# Patient Record
Sex: Male | Born: 1970 | Race: White | Hispanic: No | Marital: Married | Smoking: Former smoker
Health system: Southern US, Community
[De-identification: ages and names within clinical notes are randomized; demographics above are authoritative.]

## PROBLEM LIST (undated history)

## (undated) DIAGNOSIS — I2510 Atherosclerotic heart disease of native coronary artery without angina pectoris: Secondary | ICD-10-CM

## (undated) DIAGNOSIS — E785 Hyperlipidemia, unspecified: Secondary | ICD-10-CM

## (undated) DIAGNOSIS — R739 Hyperglycemia, unspecified: Secondary | ICD-10-CM

## (undated) DIAGNOSIS — Z720 Tobacco use: Secondary | ICD-10-CM

## (undated) DIAGNOSIS — I255 Ischemic cardiomyopathy: Secondary | ICD-10-CM

## (undated) HISTORY — DX: Hyperglycemia, unspecified: R73.9

## (undated) HISTORY — DX: Ischemic cardiomyopathy: I25.5

## (undated) HISTORY — PX: CAROTID STENT: SHX1301

## (undated) HISTORY — DX: Hyperlipidemia, unspecified: E78.5

## (undated) HISTORY — DX: Atherosclerotic heart disease of native coronary artery without angina pectoris: I25.10

## (undated) HISTORY — DX: Tobacco use: Z72.0

## (undated) HISTORY — PX: ANGIOPLASTY / STENTING FEMORAL: SUR30

---

## 1998-08-28 ENCOUNTER — Emergency Department (HOSPITAL_COMMUNITY): Admission: EM | Admit: 1998-08-28 | Discharge: 1998-08-28 | Payer: Self-pay | Admitting: Emergency Medicine

## 1998-09-11 ENCOUNTER — Encounter: Admission: RE | Admit: 1998-09-11 | Discharge: 1998-09-11 | Payer: Self-pay | Admitting: Internal Medicine

## 2000-09-27 ENCOUNTER — Emergency Department (HOSPITAL_COMMUNITY): Admission: EM | Admit: 2000-09-27 | Discharge: 2000-09-27 | Payer: Self-pay | Admitting: *Deleted

## 2002-09-12 ENCOUNTER — Emergency Department (HOSPITAL_COMMUNITY): Admission: EM | Admit: 2002-09-12 | Discharge: 2002-09-12 | Payer: Self-pay | Admitting: Emergency Medicine

## 2003-08-02 ENCOUNTER — Encounter: Admission: RE | Admit: 2003-08-02 | Discharge: 2003-08-02 | Payer: Self-pay | Admitting: Internal Medicine

## 2004-04-19 ENCOUNTER — Emergency Department (HOSPITAL_COMMUNITY): Admission: EM | Admit: 2004-04-19 | Discharge: 2004-04-19 | Payer: Self-pay | Admitting: Emergency Medicine

## 2005-01-10 ENCOUNTER — Emergency Department (HOSPITAL_COMMUNITY): Admission: EM | Admit: 2005-01-10 | Discharge: 2005-01-10 | Payer: Self-pay | Admitting: Family Medicine

## 2009-01-11 DIAGNOSIS — I2510 Atherosclerotic heart disease of native coronary artery without angina pectoris: Secondary | ICD-10-CM

## 2009-01-11 HISTORY — DX: Atherosclerotic heart disease of native coronary artery without angina pectoris: I25.10

## 2009-03-09 ENCOUNTER — Ambulatory Visit: Payer: Self-pay | Admitting: Internal Medicine

## 2009-03-09 ENCOUNTER — Encounter: Payer: Self-pay | Admitting: Emergency Medicine

## 2009-03-09 ENCOUNTER — Ambulatory Visit: Payer: Self-pay | Admitting: Cardiovascular Disease

## 2009-03-09 ENCOUNTER — Inpatient Hospital Stay (HOSPITAL_COMMUNITY): Admission: EM | Admit: 2009-03-09 | Discharge: 2009-03-12 | Payer: Self-pay | Admitting: Emergency Medicine

## 2009-03-10 DIAGNOSIS — I2510 Atherosclerotic heart disease of native coronary artery without angina pectoris: Secondary | ICD-10-CM

## 2009-03-18 DIAGNOSIS — R7309 Other abnormal glucose: Secondary | ICD-10-CM

## 2009-03-18 DIAGNOSIS — F17200 Nicotine dependence, unspecified, uncomplicated: Secondary | ICD-10-CM | POA: Insufficient documentation

## 2009-03-18 DIAGNOSIS — E785 Hyperlipidemia, unspecified: Secondary | ICD-10-CM | POA: Insufficient documentation

## 2009-03-19 ENCOUNTER — Encounter: Payer: Self-pay | Admitting: Cardiovascular Disease

## 2009-03-21 ENCOUNTER — Ambulatory Visit: Payer: Self-pay | Admitting: Cardiovascular Disease

## 2009-04-11 ENCOUNTER — Telehealth: Payer: Self-pay | Admitting: Cardiovascular Disease

## 2009-04-17 ENCOUNTER — Ambulatory Visit: Payer: Self-pay | Admitting: Internal Medicine

## 2009-04-17 DIAGNOSIS — J329 Chronic sinusitis, unspecified: Secondary | ICD-10-CM | POA: Insufficient documentation

## 2009-04-17 LAB — CONVERTED CEMR LAB
Cholesterol, target level: 200 mg/dL
HDL goal, serum: 40 mg/dL
LDL Goal: 100 mg/dL

## 2009-04-18 ENCOUNTER — Ambulatory Visit: Payer: Self-pay | Admitting: Cardiovascular Disease

## 2009-04-18 DIAGNOSIS — I252 Old myocardial infarction: Secondary | ICD-10-CM

## 2009-04-21 ENCOUNTER — Encounter: Payer: Self-pay | Admitting: Cardiovascular Disease

## 2009-04-22 ENCOUNTER — Ambulatory Visit: Payer: Self-pay | Admitting: Cardiovascular Disease

## 2009-04-22 LAB — CONVERTED CEMR LAB
ALT: 71 units/L — ABNORMAL HIGH (ref 0–53)
AST: 15 units/L (ref 0–37)
Albumin: 4 g/dL (ref 3.5–5.2)
Alkaline Phosphatase: 68 units/L (ref 39–117)
Calcium: 9.2 mg/dL (ref 8.4–10.5)
Cholesterol: 119 mg/dL (ref 0–200)
Creatinine, Ser: 0.9 mg/dL (ref 0.4–1.5)
Hgb A1c MFr Bld: 5.9 % (ref 4.6–6.5)
Total Protein: 7.2 g/dL (ref 6.0–8.3)
Triglycerides: 143 mg/dL (ref 0.0–149.0)

## 2009-08-04 ENCOUNTER — Ambulatory Visit: Payer: Self-pay | Admitting: Cardiovascular Disease

## 2009-08-14 LAB — CONVERTED CEMR LAB
ALT: 42 units/L (ref 0–53)
Total Bilirubin: 0.4 mg/dL (ref 0.3–1.2)

## 2009-10-20 ENCOUNTER — Telehealth (INDEPENDENT_AMBULATORY_CARE_PROVIDER_SITE_OTHER): Payer: Self-pay | Admitting: *Deleted

## 2009-10-22 ENCOUNTER — Encounter: Payer: Self-pay | Admitting: Cardiovascular Disease

## 2009-12-01 ENCOUNTER — Ambulatory Visit: Payer: Self-pay | Admitting: Cardiovascular Disease

## 2009-12-10 ENCOUNTER — Telehealth (INDEPENDENT_AMBULATORY_CARE_PROVIDER_SITE_OTHER): Payer: Self-pay | Admitting: *Deleted

## 2009-12-22 ENCOUNTER — Telehealth (INDEPENDENT_AMBULATORY_CARE_PROVIDER_SITE_OTHER): Payer: Self-pay | Admitting: *Deleted

## 2009-12-23 ENCOUNTER — Telehealth (INDEPENDENT_AMBULATORY_CARE_PROVIDER_SITE_OTHER): Payer: Self-pay | Admitting: *Deleted

## 2009-12-30 ENCOUNTER — Telehealth: Payer: Self-pay | Admitting: Cardiovascular Disease

## 2010-01-09 ENCOUNTER — Telehealth (INDEPENDENT_AMBULATORY_CARE_PROVIDER_SITE_OTHER): Payer: Self-pay | Admitting: *Deleted

## 2010-02-10 NOTE — Assessment & Plan Note (Signed)
Summary: f32m   Visit Type:  Follow-up Primary Provider:  Etta Grandchild MD  CC:  Sob.  History of Present Illness: This is a 40 year-old gentleman who sustained an acute myocardial infarction Feb 28th, 2011 secondary to LCx occlusion. He underwent primary PCI of a complex lesion involving the bifurcation of the first OM branch. He also underwent staged PCI of the LAD during the index hospitalization.  Both lesions were treated with drug-eluting stents. LVEF was well-preserved at the time of his MI. He presents today for follow-up evaluation.   He complains of exertional dyspnea, and relates this to weight gain and longstanding smoking. He quit cigarettes at the time of his MI earlier this year and has gained at least 10 pounds since that time. He denies chest pain, edema, lightheadedness, or syncope. No regular exercise, but does hard physical work.  Current Medications (verified): 1)  Aspirin 81 Mg Tbec (Aspirin) .... Take One Tablet By Mouth Daily 2)  Crestor 40 Mg Tabs (Rosuvastatin Calcium) .... Take 1 Tablet By Mouth Once A Day 3)  Metoprolol Succinate 25 Mg Xr24h-Tab (Metoprolol Succinate) .... Take One Tablet By Mouth Daily 4)  Effient 10 Mg Tabs (Prasugrel Hcl) .... Take 1 Tablet By Mouth Once A Day 5)  Nitrostat 0.4 Mg Subl (Nitroglycerin) .Marland Kitchen.. 1 Tablet Under Tongue At Onset of Chest Pain; You May Repeat Every 5 Minutes For Up To 3 Doses.  Allergies (verified): No Known Drug Allergies  Past History:  Past medical history reviewed for relevance to current acute and chronic problems.  Past Medical History: Reviewed history from 03/21/2009 and no changes required. CAD s/p MI 2011, LCx infarction treated with DES and staged PCI of severe prox LAD stenosis treated with DES. 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 HEENT: normal Neck: normal carotid upstrokes without bruits, JVP normal Lungs: CTA CV: RRR without murmur or gallop Abd: soft, NT, positive BS, no bruit, no organomegaly Ext: no clubbing, cyanosis, or edema. peripheral pulses 2+ and equal Skin: warm and dry without rash    EKG  Procedure date:  08/04/2009  Findings:      NSR 69 bpm, within normal limits.  Impression & Recommendations:  Problem # 1:  CAD, NATIVE VESSEL (ICD-414.01) Pt s/p MI without recurrent angina. He will remain on dual antiplatelet Rx with ASA and effient. Continue risk reduction measures as below. We discussed the importance of exercise and weight loss in detail and I applauded him on his continued abstinence from tobacco.  His updated medication list for this problem includes:    Aspirin 81 Mg Tbec (Aspirin) .Marland Kitchen... Take one tablet by mouth daily    Metoprolol Succinate 25 Mg Xr24h-tab (Metoprolol succinate) .Marland Kitchen... Take one tablet by mouth daily    Effient 10 Mg Tabs (  Prasugrel hcl) .Marland Kitchen... Take 1 tablet by mouth once a day    Nitrostat 0.4 Mg Subl (Nitroglycerin) .Marland Kitchen... 1 tablet under tongue at onset of chest pain; you may repeat every 5 minutes for up to 3 doses.  Orders: EKG w/ Interpretation (93000) TLB-Hepatic/Liver Function Pnl (80076-HEPATIC)  Problem # 2:  HYPERLIPIDEMIA (ICD-272.4) Lipids at goal. Repeat LFT's today as AST was elevated at last check (less than 2x ULN).  His updated medication list for this problem includes:    Crestor 40 Mg Tabs (Rosuvastatin calcium) .Marland Kitchen... Take 1 tablet by  mouth once a day  Orders: EKG w/ Interpretation (93000) TLB-Hepatic/Liver Function Pnl (80076-HEPATIC)  CHOL: 119 (04/18/2009)   LDL: 50 (04/18/2009)   HDL: 40.00 (04/18/2009)   TG: 143.0 (04/18/2009) CHOL (goal): 200 (04/17/2009)   LDL (goal): 100 (04/17/2009)   HDL (goal): 40 (04/17/2009)   TG (goal): 150 (04/17/2009)  Patient Instructions: 1)  Your physician recommends that you schedule a follow-up appointment in: 4 months. 2)  Your physician recommends that you have lab work today: liver (412/272.0) 3)  Your physician recommends that you continue on your current medications as directed. Please refer to the Current Medication list given to you today.

## 2010-02-10 NOTE — Letter (Signed)
Summary: Return To Work  [employer], Main Office  [address]   [place], [place] [address]   Phone: [phone]  Fax: [phone]    03/21/2009  TO: Leodis Sias IT MAY CONCERN   RE: BAYLEY HURN Udell [address] [place]   The above named individual is under my medical care and may return to work on:  April 11, 2009 with no restrictions.  If you have any further questions or need additional information, please call.     Sincerely,   Dr Tonny Bollman

## 2010-02-10 NOTE — Assessment & Plan Note (Signed)
Summary: EPH   Visit Type:  Post-hospital Primary Provider:  none  CC:  Chest soreness.  History of Present Illness: This is a 40 year-old gentleman who sustained an acute myocardial infarction Feb 28th, 2011secondary to LCx occlusion. He underwent primary PCI of a complex lesion involving the bifurcation of the first OM branch. He also underwent staged PCI of the LAD during the index hospitalization.  Both lesions were treated with drug-eluting stents. LVEF was well-preserved at the time of his MI. He presents today for follow-up evaluation.   He has had few chest pains since discharge home from the hospital, all have been nonexertional and fleeting. He denies dyspnea, orthopnea, or PND. No lightheadedness or syncope. He is eager to return to work.  Current Medications (verified): 1)  Aspirin Ec 325 Mg Tbec (Aspirin) .... Take One Tablet By Mouth Daily 2)  Acetaminophen 325 Mg Tabs (Acetaminophen) .... As Needed 3)  Crestor 40 Mg Tabs (Rosuvastatin Calcium) .... Take 1 Tablet By Mouth Once A Day 4)  Metoprolol Tartrate 50 Mg Tabs (Metoprolol Tartrate) .... Take One Tablet By Mouth Twice A Day 5)  Effient 10 Mg Tabs (Prasugrel Hcl) .... Take 1 Tablet By Mouth Once A Day 6)  Nitrostat 0.4 Mg Subl (Nitroglycerin) .Marland Kitchen.. 1 Tablet Under Tongue At Onset of Chest Pain; You May Repeat Every 5 Minutes For Up To 3 Doses.  Allergies (verified): No Known Drug Allergies  Past History:  Past medical history reviewed for relevance to current acute and chronic problems.  Past Medical History: CAD s/p MI 2011, LCx infarction treated with DES and staged PCI of severe prox LAD stenosis treated with DES. 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 HEENT: normal Neck: normal carotid upstrokes without bruits, JVP normal Lungs: CTA CV: RRR without murmur or gallop Abd: soft, NT, positive BS, no bruit, no organomegaly Ext: no clubbing, cyanosis, or edema. peripheral pulses 2+ and equal. Right radial site shows intact pulse with no edema or bruising. Skin: warm and dry without rash    EKG  Procedure date:  03/21/2009  Findings:      NSR with age-indeterminate posterior MI, hyperacute appearing Twaves anteriorly are unchanged from prior tracings.  Impression & Recommendations:  Problem # 1:  CAD, NATIVE VESSEL (ICD-414.01) Pt is stable following his recent MI. Will continue current medical therapy at present. 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 His chest pain sounds noncardiac. Recommend continuation of DAPT with ASA and Effient as well as secondary risk reduction measures as below. We discussed dietary modification and exercise in detail today.  His updated medication list for this problem includes:    Aspirin Ec 325 Mg Tbec (Aspirin) .Marland KitchenMarland KitchenMarland KitchenMarland Kitchen  Take one tablet by mouth daily    Metoprolol Tartrate 50 Mg Tabs (Metoprolol tartrate) .Marland Kitchen... Take one tablet by mouth twice a day    Effient 10 Mg Tabs (Prasugrel hcl) .Marland Kitchen... Take 1 tablet by mouth once a day    Nitrostat 0.4 Mg Subl (Nitroglycerin) .Marland Kitchen... 1 tablet under tongue at onset of chest pain; you may repeat every 5 minutes for up to 3 doses.  Problem # 2:  HYPERLIPIDEMIA (ICD-272.4) Pt new to statin Rx with Crestor - his LDL in the hospital was 179, with a total chol of 250, and HDL 44. Will repeat lipids and LFT's at  the time of his f/u in one month.  His updated medication list for this problem includes:    Crestor 40 Mg Tabs (Rosuvastatin calcium) .Marland Kitchen... Take 1 tablet by mouth once a day  Orders: EKG w/ Interpretation (93000)  Problem # 3:  TOBACCO ABUSE (ICD-305.1) Discussed continued importance of tobacco cessation. His wife has quit as well.  Patient Instructions: 1)  Your physician recommends that you schedule a follow-up appointment in: 1 MONTH 2)  Your physician recommends that you return for a FASTING LIPID, LIVER, BMP, HgbA1C in 1 MONTH (412, 272.0) Nothing to eat or drink after midnight for labwork.  3)  Your physician recommends that you continue on your current medications as directed. Please refer to the Current Medication list given to you today. 4)  Your physician deems you medically cleared to return to work.  A return to work note was provided today.  Pt can return to work on 04/11/09 with no restrictions.

## 2010-02-10 NOTE — Assessment & Plan Note (Signed)
Summary: NEW/ [employer] /NWS #   Vital Signs:  Patient profile:   40 year old male Height:      71 inches (180.34 cm) Weight:      194.50 pounds (88.41 kg) BMI:     27.23 O2 Sat:      98 % on Room air Temp:     97.9 degrees F (36.61 degrees C) oral Pulse rate:   79 / minute Pulse rhythm:   regular Resp:     16 per minute BP sitting:   122 / 86  (left arm) Cuff size:   large  Vitals Entered ByMorrie Sheldon Johnson(April 17, 2009 2:41 PM)  Nutrition Counseling: Patient's BMI is greater than 25 and therefore counseled on weight management options.  O2 Flow:  Room air CC: pt here to establish care/pt concerned about bp medication says taking both doses makes him dizzy and turn white/pt states he stopped taking morning dose/aj, Lipid Management   Primary Care Provider:  Etta Grandchild MD  CC:  pt here to establish care/pt concerned about bp medication says taking both doses makes him dizzy and turn white/pt states he stopped taking morning dose/aj and Lipid Management.  History of Present Illness: New to me is this young man who had an MI one month ago and is doing well after stenting. He sees me today with a 2 year hx. of frequent sinus infections that have been treated at [facility]. He describes taking a course of antibiotics and getting better but then has a recurrence of facial/tooth pain, runny nose, and headaches. He has had migraines since childhood but says these headaches are in a different location (over the face).  Lipid Management History:      Positive NCEP/ATP III risk factors include family history for ischemic heart disease (females less than 37 years old) and ASHD (either angina/prior MI/prior CABG).  Negative NCEP/ATP III risk factors include male age less than 87 years old, non-diabetic, non-tobacco-user status, no prior stroke/TIA, no peripheral vascular disease, and no history of aortic aneurysm.        The patient states that he knows about the "Therapeutic Lifestyle Change"  diet.  His compliance with the TLC diet is fair.  The patient expresses understanding of adjunctive measures for cholesterol lowering.  Adjunctive measures started by the patient include aerobic exercise, fiber, ASA, limit alcohol consumpton, and weight reduction.  He expresses no side effects from his lipid-lowering medication.  The patient denies any symptoms to suggest myopathy or liver disease.     Preventive Screening-Counseling & Management  Alcohol-Tobacco     Alcohol drinks/day: 0     Smoking Status: quit     Smoking Cessation Counseling: yes     Smoke Cessation Stage: quit     Pack years: 40     Tobacco Counseling: to remain off tobacco products  Hep-HIV-STD-Contraception     Hepatitis Risk: no risk noted     HIV Risk: no risk noted     STD Risk: no risk noted      Sexual History:  currently monogamous.        Drug Use:  never.        Blood Transfusions:  no.    Medications Prior to Update: 1)  Aspirin Ec 325 Mg Tbec (Aspirin) .... Take One Tablet By Mouth Daily 2)  Acetaminophen 325 Mg Tabs (Acetaminophen) .... As Needed 3)  Crestor 40 Mg Tabs (Rosuvastatin Calcium) .... Take 1 Tablet By Mouth Once A Day  4)  Metoprolol Tartrate 50 Mg Tabs (Metoprolol Tartrate) .... Take One Tablet By Mouth Twice A Day 5)  Effient 10 Mg Tabs (Prasugrel Hcl) .... Take 1 Tablet By Mouth Once A Day 6)  Nitrostat 0.4 Mg Subl (Nitroglycerin) .Marland Kitchen.. 1 Tablet Under Tongue At Onset of Chest Pain; You May Repeat Every 5 Minutes For Up To 3 Doses.  Current Medications (verified): 1)  Aspirin Ec 325 Mg Tbec (Aspirin) .... Take One Tablet By Mouth Daily 2)  Acetaminophen 325 Mg Tabs (Acetaminophen) .... As Needed 3)  Crestor 40 Mg Tabs (Rosuvastatin Calcium) .... Take 1 Tablet By Mouth Once A Day 4)  Metoprolol Tartrate 50 Mg Tabs (Metoprolol Tartrate) .... Take One Tablet By Mouth Twice A Day 5)  Effient 10 Mg Tabs (Prasugrel Hcl) .... Take 1 Tablet By Mouth Once A Day 6)  Nitrostat 0.4 Mg Subl  (Nitroglycerin) .Marland Kitchen.. 1 Tablet Under Tongue At Onset of Chest Pain; You May Repeat Every 5 Minutes For Up To 3 Doses. 7)  Ceftin 500 Mg Tab (Cefuroxime Axetil) .... Take One (1) Tablet By Mouth Two (2) Times A Day X 30 Days  Allergies (verified): No Known Drug Allergies  Past History:  Past Medical History: Reviewed history from 03/21/2009 and no changes required. CAD s/p MI 2011, LCx infarction treated with DES and staged PCI of severe prox LAD stenosis treated with DES. 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      Family History: Reviewed history from 03/18/2009 and no changes required.   Both of his parents are living and he is not aware of  coronary artery disease in either parents.  There is no coronary artery  disease in siblings and no family history of sudden death in any  members.   Social History: Reviewed history from 03/18/2009 and no changes required.  [place]   and family.  He works as an Personnel officer.  He has a greater than 20-pack-  year history of ongoing tobacco use, but denies any history of alcohol  or drug abuse.  Smoking Status:  quit Hepatitis Risk:  no risk noted HIV Risk:  no risk noted STD Risk:  no risk noted Sexual History:  currently monogamous Drug Use:  never Blood Transfusions:  no  Review of Systems  The patient denies anorexia, fever, weight loss, prolonged cough, abdominal pain, suspicious skin lesions, depression, and enlarged lymph nodes.   ENT:  Complains of nasal congestion, nosebleeds, postnasal drainage, and sinus pressure; denies decreased hearing, difficulty swallowing, ear discharge, earache, hoarseness, ringing in ears, and sore throat. Resp:  Denies  chest pain with inspiration, cough, coughing up blood, shortness of breath, sputum productive, and wheezing.  Physical Exam  General:  alert, well-developed, well-nourished, well-hydrated, appropriate dress, normal appearance, healthy-appearing, cooperative to examination, and good hygiene.   Head:  normocephalic, atraumatic, no abnormalities observed, and no abnormalities palpated.   Eyes:  No corneal or conjunctival inflammation noted. EOMI. Perrla. Funduscopic exam benign, without hemorrhages, exudates or papilledema. Vision grossly normal. Ears:  R ear normal and L ear normal.   Nose:  External nasal examination shows no deformity or inflammation. Nasal mucosa are pink and moist without lesions or exudates. Mouth:  Oral mucosa and oropharynx without lesions or exudates.  Teeth in  good repair. Neck:  supple, full ROM, no masses, no thyromegaly, and no JVD.   Lungs:  Normal respiratory effort, chest expands symmetrically. Lungs are clear to auscultation, no crackles or wheezes. Heart:  Normal rate and regular rhythm. S1 and S2 normal without gallop, murmur, click, rub or other extra sounds. Abdomen:  Bowel sounds positive,abdomen soft and non-tender without masses, organomegaly or hernias noted. Msk:  No deformity or scoliosis noted of thoracic or lumbar spine.   Pulses:  R and L carotid,radial,femoral,dorsalis pedis and posterior tibial pulses are full and equal bilaterally Extremities:  No clubbing, cyanosis, edema, or deformity noted with normal full range of motion of all joints.   Neurologic:  No cranial nerve deficits noted. Station and gait are normal. Plantar reflexes are down-going bilaterally. DTRs are symmetrical throughout. Sensory, motor and coordinative functions appear intact. Skin:  turgor normal, color normal, no rashes, no suspicious lesions, no ecchymoses, no petechiae, no purpura, no ulcerations, and no edema.   Cervical Nodes:  no anterior cervical adenopathy and no  posterior cervical adenopathy.   Axillary Nodes:  no R axillary adenopathy and no L axillary adenopathy.   Inguinal Nodes:  no R inguinal adenopathy and no L inguinal adenopathy.   Psych:  Cognition and judgment appear intact. Alert and cooperative with normal attention span and concentration. No apparent delusions, illusions, hallucinations   Impression & Recommendations:  Problem # 1:  SINUSITIS, CHRONIC (ICD-473.9) Assessment New  His updated medication list for this problem includes:    Ceftin 500 Mg Tab (Cefuroxime axetil) .Marland Kitchen... Take one (1) tablet by mouth two (2) times a day x 30 days  Orders: T-Sinuses Complete (70220TC)  Take antibiotics for full duration. Discussed treatment options including indications for coronal CT scan of sinuses and ENT referral.   Complete Medication List: 1)  Aspirin Ec 325 Mg Tbec (Aspirin) .... Take one tablet by mouth daily 2)  Acetaminophen 325 Mg Tabs (Acetaminophen) .... As needed 3)  Crestor 40 Mg Tabs (Rosuvastatin calcium) .... Take 1 tablet by mouth once a day 4)  Metoprolol Tartrate 50 Mg Tabs (Metoprolol tartrate) .... Take one tablet by mouth twice a day 5)  Effient 10 Mg Tabs (Prasugrel hcl) .... Take 1 tablet by mouth once a day 6)  Nitrostat 0.4 Mg Subl (Nitroglycerin) .Marland Kitchen.. 1 tablet under tongue at onset of chest pain; you may repeat every 5 minutes for up to 3 doses. 7)  Ceftin 500 Mg Tab (Cefuroxime axetil) .... Take one (1) tablet by mouth two (2) times a day x 30 days  Lipid Assessment/Plan:      Based on NCEP/ATP III, the patient's risk factor category is "history of coronary disease, peripheral vascular disease, cerebrovascular disease, or aortic aneurysm".  The patient's lipid goals are as follows: Total cholesterol goal is 200; LDL cholesterol goal is 100; HDL cholesterol goal is 40; Triglyceride goal is 150.    Patient Instructions: 1)  Please schedule a follow-up appointment in 1 month. 2)  Take your antibiotic as  prescribed until ALL of it is gone, but stop if you develop a rash or swelling and contact our office as soon as possible. 3)  Acute sinusitis symptoms for less than 10 days are not helped by antibiotics.Use warm moist compresses, and over the counter decongestants ( only as directed). Call if no improvement in 5-7 days, sooner if increasing pain, fever, or new symptoms. Prescriptions: CEFTIN 500 MG TAB (CEFUROXIME AXETIL) Take one (1) tablet by mouth two (2) times  a day X 30 days  #60 x 1   Entered and Authorized by:   Etta Grandchild MD   Signed by:   Etta Grandchild MD on 04/17/2009   Method used:   Electronically to        [facility]. [phone]* (retail)       [address]       [place], [place]  [address]       Ph: [phone]       Fax: [phone]   RxID:   [id number]

## 2010-02-10 NOTE — Progress Notes (Signed)
Summary: resend refill to correct pharmacy  Phone Note Call from Patient   Caller: Spouse Summary of Call: refill went to [facility], [facility] Initial call taken by: Glynda Jaeger,  December 10, 2009 12:51 PM  Follow-up for Phone Call        RX faxed to pharmacy. Vikki Ports  December 11, 2009 2:31 PM     Prescriptions: CRESTOR 10 MG TABS (ROSUVASTATIN CALCIUM) Take one tablet by mouth daily.  #30 x 8   Entered by:   Vikki Ports   Authorized by:   Norva Karvonen, MD   Signed by:   Vikki Ports on 12/11/2009   Method used:   Faxed to ...       [facility] [address] [phone]* (retail)       [address] [place], [place]  [address]       Ph: [phone]       Fax: [phone]   RxID:   [id number]

## 2010-02-10 NOTE — Letter (Signed)
Summary: [facility] Notice of Action   [facility] Notice of Action   Imported By: Roderic Ovens 11/13/2009 11:16:50  _____________________________________________________________________  External Attachment:    Type:   Image     Comment:   External Document

## 2010-02-10 NOTE — Progress Notes (Signed)
Summary: Effient Approval  Phone Note Call from Patient Call back at Home Phone [phone]   Caller: Spouse Summary of Call: insurance will not cover effient, please call insurance company [employer] @ [phone] ID # Russell Miller [id number] Initial call taken by: Migdalia Dk,  April 11, 2009 9:53 AM  Follow-up for Phone Call        I spoke with the pt and he took his last pill of effient today.  I placed 28 tablets of Effient at the front desk for the pt to pick-up today.  Per Dr Excell Seltzer he places all STEMI pt's on Effient.  Better outcomes.   Follow-up by: Julieta Gutting, RN, BSN,  April 11, 2009 2:06 PM  Additional Follow-up for Phone Call Additional follow up Details #1::        The pt came into the office and picked up samples.  The pt said he had been taking Metoprolol Tartrate 50mg  two times a day.  The pt said the second dose made him feel bad so he has only been taking this in the morning and feels better.  The pt saw his PCP and his BP was okay.  The pt is scheduled to see Dr Excell Seltzer on 04/22/09.  I told the pt to keep that appt and we would review his medications at that time.    Additional Follow-up by: Julieta Gutting, RN, BSN,  April 18, 2009 9:10 AM    Additional Follow-up for Phone Call Additional follow up Details #2::    Correct number for authorization [id number].  Effient approved through April 2012.   Follow-up by: Julieta Gutting, RN, BSN,  April 21, 2009 2:49 PM

## 2010-02-10 NOTE — Assessment & Plan Note (Signed)
Summary: 1 month rov   Visit Type:  1 month follow up Primary Provider:  Etta Grandchild MD  CC:  No cardiac complaints.  History of Present Illness: This is a 40 year-old gentleman who sustained an acute myocardial infarction Feb 28th, 2011 secondary to LCx occlusion. He underwent primary PCI of a complex lesion involving the bifurcation of the first OM branch. He also underwent staged PCI of the LAD during the index hospitalization.  Both lesions were treated with drug-eluting stents. LVEF was well-preserved at the time of his MI. He presents today for follow-up evaluation.   Overall he is doing well at present. He had a lot of difficulty with weakness and lightheadedness at work. He reduced his beta blocker dose on his own and is taking it only at bedtime.  He feels better with the once daily dosing, but continues to have some lightheadedness. He denies chest pain, dyspnea, edema, or palpitations.  Current Medications (verified): 1)  Aspirin Ec 325 Mg Tbec (Aspirin) .... Take One Tablet By Mouth Daily 2)  Crestor 40 Mg Tabs (Rosuvastatin Calcium) .... Take 1 Tablet By Mouth Once A Day 3)  Metoprolol Tartrate 50 Mg Tabs (Metoprolol Tartrate) .... Take One Tablet By Mouth Twice A Day 4)  Effient 10 Mg Tabs (Prasugrel Hcl) .... Take 1 Tablet By Mouth Once A Day 5)  Nitrostat 0.4 Mg Subl (Nitroglycerin) .Marland Kitchen.. 1 Tablet Under Tongue At Onset of Chest Pain; You May Repeat Every 5 Minutes For Up To 3 Doses.  Allergies (verified): No Known Drug Allergies  Past History:  Past medical history reviewed for relevance to current acute and chronic problems.  Past Medical History: Reviewed history from 03/21/2009 and no changes required. CAD s/p MI 2011, LCx infarction treated with DES and staged PCI of severe prox LAD stenosis treated with DES. ISCHEMIC CARDIOMYOPATHY (ICD-414.8), mild with LVEF greater than 45% HYPERLIPIDEMIA (ICD-272.4) HYPERGLYCEMIA (ICD-790.29) TOBACCO ABUSE (ICD-305.1), quit  at time of MI Feb 2011  Review of Systems       Negative except as per HPI   Vital Signs:  Patient profile:   40 year old male Height:      71 inches Weight:      198.75 pounds BMI:     27.82 Pulse rate:   70 / minute Pulse rhythm:   regular Resp:     18 per minute BP sitting:   112 / 71  (left arm) Cuff size:   large  Vitals Entered By: Vikki Ports (April 22, 2009 3:56 PM)  Physical Exam  General:  Pt is alert and oriented, in no acute distress. HEENT: normal Neck: normal carotid upstrokes without bruits, JVP normal Lungs: CTA CV: RRR without murmur or gallop Abd: soft, NT, positive BS, no bruit, no organomegaly Ext: no clubbing, cyanosis, or edema. peripheral pulses 2+ and equal Skin: warm and dry without rash    Impression & Recommendations:  Problem # 1:  CAD, NATIVE VESSEL (ICD-414.01) Patient status post recent MI. He is not tolerating beta blocker therapy well, likely secondary to symptomatic hypotension. Will reduce his overall dose and put him on metoprolol succinate rather than tartrate. Will dose this at 25 mg at bedtime. Otherwise continue dual antiplatelet therapy with aspirin and prasugrel, but reduce ASA dose to 81 mg daily.  His updated medication list for this problem includes:    Aspirin 81 Mg Tbec (Aspirin) .Marland Kitchen... Take one tablet by mouth daily    Metoprolol Succinate 25 Mg Xr24h-tab (Metoprolol succinate) .Marland KitchenMarland KitchenMarland KitchenMarland Kitchen  Take one tablet by mouth daily    Effient 10 Mg Tabs (Prasugrel hcl) .Marland Kitchen... Take 1 tablet by mouth once a day    Nitrostat 0.4 Mg Subl (Nitroglycerin) .Marland Kitchen... 1 tablet under tongue at onset of chest pain; you may repeat every 5 minutes for up to 3 doses.  His updated medication list for this problem includes:    Aspirin 81 Mg Tbec (Aspirin) .Marland Kitchen... Take one tablet by mouth daily    Metoprolol Succinate 25 Mg Xr24h-tab (Metoprolol succinate) .Marland Kitchen... Take one tablet by mouth daily    Effient 10 Mg Tabs (Prasugrel hcl) .Marland Kitchen... Take 1 tablet by mouth  once a day    Nitrostat 0.4 Mg Subl (Nitroglycerin) .Marland Kitchen... 1 tablet under tongue at onset of chest pain; you may repeat every 5 minutes for up to 3 doses.  Problem # 2:  HYPERLIPIDEMIA (ICD-272.4) Recent labs reviewed, LDL at goal of less than 70. AST was mildly elevated and we'll repeat LFTs in 3 months.  His updated medication list for this problem includes:    Crestor 40 Mg Tabs (Rosuvastatin calcium) .Marland Kitchen... Take 1 tablet by mouth once a day  CHOL: 119 (04/18/2009)   LDL: 50 (04/18/2009)   HDL: 40.00 (04/18/2009)   TG: 143.0 (04/18/2009) CHOL (goal): 200 (04/17/2009)   LDL (goal): 100 (04/17/2009)   HDL (goal): 40 (04/17/2009)   TG (goal): 150 (04/17/2009)  Problem # 3:  HYPERGLYCEMIA (ICD-790.29) Reviewed the importance of low carbohydrate diet to prevent progression towards type 2 diabetes.  Patient Instructions: 1)  Your physician recommends that you schedule a follow-up appointment in: 3 MONTHS 2)  Your physician has recommended you make the following change in your medication: DECREASE Aspirin to 81mg  once a day, STOP Metoprolol Tartrate, START Metoprolol Succinate 25mg  at bedtime 3)  Your physician recommends that you have lab work in: 3 MONTHS (Liver 412, 272.0)  Prescriptions: METOPROLOL SUCCINATE 25 MG XR24H-TAB (METOPROLOL SUCCINATE) Take one tablet by mouth daily  #30 x 6   Entered by:   Julieta Gutting, RN, BSN   Authorized by:   Norva Karvonen, MD   Signed by:   Julieta Gutting, RN, BSN on 04/22/2009   Method used:   Electronically to        [facility]. [phone]* (retail)       [address] [place], [place]  [address]       Ph: [phone]       Fax: [phone]   RxID:   [id number]

## 2010-02-10 NOTE — Assessment & Plan Note (Signed)
Summary: 4 month rov   Visit Type:  4 months follow up Primary Provider:  Etta Grandchild MD  CC:  Sob.  History of Present Illness: This is a 40 year-old gentleman who sustained an acute myocardial infarction Feb 28th, 2011 secondary to LCx occlusion. He underwent primary PCI of a complex lesion involving the bifurcation of the first OM branch. He also underwent staged PCI of the LAD during the index hospitalization.  Both lesions were treated with drug-eluting stents. LVEF was well-preserved at the time of his MI. He presents today for follow-up evaluation.   He continues to have short paroxysms of dyspnea, unrelated to exertion. He denies edema, orthopnea, or PND. No chest pain. No symptoms that are reminiscent of his MI. No cough or other complaints.  Current Medications (verified): 1)  Aspirin 81 Mg Tbec (Aspirin) .... Take One Tablet By Mouth Daily 2)  Crestor 40 Mg Tabs (Rosuvastatin Calcium) .... Take 1 Tablet By Mouth Once A Day 3)  Metoprolol Succinate 25 Mg Xr24h-Tab (Metoprolol Succinate) .... Take One Tablet By Mouth Daily 4)  Effient 10 Mg Tabs (Prasugrel Hcl) .... Take 1 Tablet By Mouth Once A Day 5)  Nitrostat 0.4 Mg Subl (Nitroglycerin) .Marland Kitchen.. 1 Tablet Under Tongue At Onset of Chest Pain; You May Repeat Every 5 Minutes For Up To 3 Doses.  Allergies (verified): No Known Drug Allergies  Past History:  Past medical history reviewed for relevance to current acute and chronic problems.  Past Medical History: Reviewed history from 03/21/2009 and no changes required. CAD s/p MI 2011, LCx infarction treated with DES and staged PCI of severe prox LAD stenosis treated with DES. 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 HEENT: normal Neck: normal carotid upstrokes without bruits, JVP normal Lungs: CTA CV: RRR without murmur or gallop Abd: soft, NT, positive BS, no bruit, no organomegaly Ext: no clubbing, cyanosis, or edema. peripheral pulses 2+ and equal Skin: warm and dry without rash    Impression & Recommendations:  Problem # 1:  OLD MYOCARDIAL INFARCTION (ICD-412) Pt on appropriate Rx - lipid lowering, ASA, Effient, beta-blocker. Continue current medical program. He has quit smoking. Encouraged exercise.  His updated medication list for this problem includes:    Aspirin 81 Mg Tbec (Aspirin) .Marland Kitchen... Take one tablet by mouth daily    Metoprolol Succinate 25 Mg Xr24h-tab (Metoprolol succinate) .Marland Kitchen... Take one tablet by mouth daily    Effient 10 Mg Tabs (Prasugrel hcl) .Marland Kitchen... Take 1 tablet by mouth once a day    Nitrostat 0.4 Mg Subl (Nitroglycerin) .Marland Kitchen... 1 tablet under tongue at onset of chest pain; you may repeat every 5 minutes for up to 3 doses.  Problem # 2:  HYPERLIPIDEMIA (ICD-272.4) He reports myalgias and rosuvastatin dose reduced.  His updated medication list for this problem includes:    Crestor 10 Mg Tabs (Rosuvastatin calcium) .Marland Kitchen... Take one tablet by mouth daily.  CHOL: 119 (04/18/2009)   LDL: 50 (04/18/2009)   HDL: 40.00 (04/18/2009)   TG: 143.0 (04/18/2009) CHOL (goal): 200 (04/17/2009)   LDL (goal): 100 (04/17/2009)   HDL (goal): 40 (04/17/2009)   TG (goal): 150 (04/17/2009)  Other Orders: Treadmill (Treadmill)  Patient Instructions: 1)  Your physician recommends that you return for a FASTING LIPID and  LIVER Profile in [facility] 2012---Nothing to eat or drink after midnight (412, 272.0)  2)  Your physician has recommended you make the following change in your medication: DECREASE Crestor to 10mg  once a day 3)  Your physician has requested that you have an exercise tolerance test in [facility] 2012.  For further information please visit [email or web address]  Please also follow instruction sheet, as given. Prescriptions: CRESTOR 10 MG TABS (ROSUVASTATIN CALCIUM) Take one tablet by mouth daily.  #30 x 8   Entered by:   Julieta Gutting, RN, BSN   Authorized by:   Norva Karvonen, MD   Signed by:   Julieta Gutting, RN, BSN on 12/01/2009   Method used:   Electronically to        [facility] [address] [phone]* (retail)       [address] [place], [place]  [address]       Ph: [phone]       Fax: [phone]   RxID:   [id number]

## 2010-02-10 NOTE — Progress Notes (Signed)
Summary: refill  Phone Note Refill Request Message from:  Patient on October 20, 2009 3:42 PM  Refills Requested: Medication #1:  EFFIENT 10 MG TABS Take 1 tablet by mouth once a day [facility] [phone]  Initial call taken by: Judie Grieve,  October 20, 2009 3:42 PM  Follow-up for Phone Call        Rx faxed to pharmacy. Vikki Ports  October 21, 2009 11:47 AM     Prescriptions: EFFIENT 10 MG TABS (PRASUGREL HCL) Take 1 tablet by mouth once a day  #30 x 6   Entered by:   Vikki Ports   Authorized by:   Norva Karvonen, MD   Signed by:   Vikki Ports on 10/21/2009   Method used:   Faxed to ...       [facility] [address] [facility] [phone]* (retail)       [address]       [place], [place]  [address]       Ph: [phone]       Fax: [phone]   RxID:   [id number]

## 2010-02-10 NOTE — Letter (Signed)
Summary: [facility]   Imported By: Kassie Mends 04/11/2009 09:34:20  _____________________________________________________________________  External Attachment:    Type:   Image     Comment:   External Document

## 2010-02-12 NOTE — Progress Notes (Signed)
  ROI received Via Fax, All Cardiac records faxed to [facility] @ [phone]. [facility] [place]  January 09, 2010 11:07 AM

## 2010-02-12 NOTE — Progress Notes (Signed)
Summary: Change cholesterol med  Phone Note Outgoing Call   Call placed by: Julieta Gutting, RN, BSN,  December 30, 2009 11:18 AM Call placed to: Patient Summary of Call: Crestor is not approved by the pt's insurance.  Per Dr Excell Seltzer the pt can switch to Simvastatin 40mg  daily and recheck lipid and liver in 3 MONTHS. I left message for the pt to call back.  Julieta Gutting, RN, BSN  December 30, 2009 11:19 AM  Follow-up for Phone Call        pt's wife brandy returning call [phone] ext [phone] Glynda Jaeger  December 31, 2009 10:04 AM  I spoke with the pt's wife and made her aware that we need to change the pt's cholesterol medication.  A Rx will be sent in for Simvastatin 40mg  daily.  The pt is scheduled for labwork in March.  Follow-up by: Julieta Gutting, RN, BSN,  December 31, 2009 2:27 PM    New/Updated Medications: SIMVASTATIN 40 MG TABS (SIMVASTATIN) Take one tablet by mouth daily at bedtime Prescriptions: SIMVASTATIN 40 MG TABS (SIMVASTATIN) Take one tablet by mouth daily at bedtime  #30 x 8   Entered by:   Julieta Gutting, RN, BSN   Authorized by:   Norva Karvonen, MD   Signed by:   Julieta Gutting, RN, BSN on 12/31/2009   Method used:   Electronically to        [facility] [address] [phone]* (retail)       [address] [place], [place]  [address]       Ph: [phone]       Fax: [phone]   RxID:   [id number]

## 2010-02-12 NOTE — Progress Notes (Signed)
Summary: STATUS OF PRE AUTH FOR CRESTOR  Phone Note Call from Patient   Caller: Patient [phone] ORK TO LEAVE MESSAGE Reason for Call: Acute Illness, Talk to Nurse Summary of Call: PT'S WIFE CALLING BACK RE PRE AUTH-UPSET PT BEEN OFF MED FOR 3 WEEKS-WANTS TO KNOW THE STATUS Initial call taken by: Glynda Jaeger,  December 23, 2009 12:49 PM  Follow-up for Phone Call        Samples on crestor 10mg  # 35 placed at front desk. Message left pt. to pick them up. prior auth. form will be faxed to [place] from insurance company. Vikki Ports  December 23, 2009 4:21 PM

## 2010-02-12 NOTE — Progress Notes (Signed)
Summary: medication need authorization  Phone Note Refill Request Message from:  Patient on December 22, 2009 2:56 PM  Refills Requested: Medication #1:  CRESTOR 10 MG TABS Take one tablet by mouth daily. [facility] need preauthorization  Initial call taken by: Judie Grieve,  December 22, 2009 2:57 PM Caller: Patient  Follow-up for Phone Call        Crestor 10mg  35 samples placed at front desk. Message left pt. at answer machine to pick them up. Prior auth. form requested to insurance.Vikki Ports  December 23, 2009 3:52 PM

## 2010-02-13 NOTE — Letter (Signed)
Summary: IT trainer Form   [employer] [place]   Imported By: Roderic Ovens 11/21/2009 14:15:42  _____________________________________________________________________  External Attachment:    Type:   Image     Comment:   External Document

## 2010-03-01 ENCOUNTER — Emergency Department (HOSPITAL_COMMUNITY)
Admission: EM | Admit: 2010-03-01 | Discharge: 2010-03-01 | Disposition: A | Discharge status: Home / Self Care | Payer: BC Managed Care – PPO | Attending: Emergency Medicine | Admitting: Emergency Medicine

## 2010-03-01 ENCOUNTER — Emergency Department (HOSPITAL_COMMUNITY): Payer: BC Managed Care – PPO

## 2010-03-01 DIAGNOSIS — E7800 Pure hypercholesterolemia, unspecified: Secondary | ICD-10-CM | POA: Insufficient documentation

## 2010-03-01 DIAGNOSIS — I252 Old myocardial infarction: Secondary | ICD-10-CM | POA: Insufficient documentation

## 2010-03-01 DIAGNOSIS — Y929 Unspecified place or not applicable: Secondary | ICD-10-CM | POA: Insufficient documentation

## 2010-03-01 DIAGNOSIS — M25569 Pain in unspecified knee: Secondary | ICD-10-CM | POA: Insufficient documentation

## 2010-03-01 DIAGNOSIS — IMO0002 Reserved for concepts with insufficient information to code with codable children: Secondary | ICD-10-CM | POA: Insufficient documentation

## 2010-03-01 DIAGNOSIS — S81809A Unspecified open wound, unspecified lower leg, initial encounter: Secondary | ICD-10-CM | POA: Insufficient documentation

## 2010-03-01 DIAGNOSIS — S81009A Unspecified open wound, unspecified knee, initial encounter: Secondary | ICD-10-CM | POA: Insufficient documentation

## 2010-03-01 DIAGNOSIS — Z79899 Other long term (current) drug therapy: Secondary | ICD-10-CM | POA: Insufficient documentation

## 2010-03-24 ENCOUNTER — Encounter (INDEPENDENT_AMBULATORY_CARE_PROVIDER_SITE_OTHER): Payer: BC Managed Care – PPO

## 2010-03-24 ENCOUNTER — Encounter: Payer: Self-pay | Admitting: Physician Assistant

## 2010-03-24 ENCOUNTER — Encounter (INDEPENDENT_AMBULATORY_CARE_PROVIDER_SITE_OTHER): Payer: BC Managed Care – PPO | Admitting: Cardiovascular Disease

## 2010-03-24 ENCOUNTER — Other Ambulatory Visit (INDEPENDENT_AMBULATORY_CARE_PROVIDER_SITE_OTHER): Payer: BC Managed Care – PPO

## 2010-03-24 ENCOUNTER — Encounter: Payer: Self-pay | Admitting: Cardiovascular Disease

## 2010-03-24 ENCOUNTER — Other Ambulatory Visit: Payer: Self-pay | Admitting: Cardiovascular Disease

## 2010-03-24 DIAGNOSIS — E785 Hyperlipidemia, unspecified: Secondary | ICD-10-CM

## 2010-03-24 DIAGNOSIS — R0989 Other specified symptoms and signs involving the circulatory and respiratory systems: Secondary | ICD-10-CM

## 2010-03-24 DIAGNOSIS — I252 Old myocardial infarction: Secondary | ICD-10-CM

## 2010-03-24 LAB — HEPATIC FUNCTION PANEL
ALT: 50 U/L (ref 0–53)
AST: 25 U/L (ref 0–37)
Bilirubin, Direct: 0.1 mg/dL (ref 0.0–0.3)
Total Bilirubin: 0.6 mg/dL (ref 0.3–1.2)

## 2010-03-24 LAB — LIPID PANEL
Total CHOL/HDL Ratio: 7
Triglycerides: 281 mg/dL — ABNORMAL HIGH (ref 0.0–149.0)

## 2010-03-24 LAB — LDL CHOLESTEROL, DIRECT: Direct LDL: 196.5 mg/dL

## 2010-03-26 ENCOUNTER — Telehealth: Payer: Self-pay | Admitting: Cardiology

## 2010-03-31 NOTE — Miscellaneous (Signed)
Summary: Restart Crestor  Clinical Lists Changes  Medications: Changed medication from SIMVASTATIN 40 MG TABS (SIMVASTATIN) Take one tablet by mouth daily at bedtime to CRESTOR 10 MG TABS (ROSUVASTATIN CALCIUM) Take one tablet by mouth daily. - Signed Rx of CRESTOR 10 MG TABS (ROSUVASTATIN CALCIUM) Take one tablet by mouth daily.;  #30 x 6;  Signed;  Entered by: Julieta Gutting, RN, BSN;  Authorized by: Norva Karvonen, MD;  Method used: Electronically to [facility]. [phone]*, [address], [place], [place]  [address], Ph: [phone], Fax: [phone]    Prescriptions: CRESTOR 10 MG TABS (ROSUVASTATIN CALCIUM) Take one tablet by mouth daily.  #30 x 6   Entered by:   Julieta Gutting, RN, BSN   Authorized by:   Norva Karvonen, MD   Signed by:   Julieta Gutting, RN, BSN on 03/24/2010   Method used:   Electronically to        [facility] [address] [phone]* (retail)       [address] [place], [place]  [address]       Ph: [phone]       Fax: [phone]   RxID:   [id number]  Per Dr Excell Seltzer the pt stopped Simvastatin due to dizziness after taking for 3 days.  Dr Excell Seltzer recommended that the pt restart Crestor 10mg  daily.  Julieta Gutting, RN, BSN  March 24, 2010 11:04 AM

## 2010-03-31 NOTE — Progress Notes (Signed)
Summary: lab results  Phone Note Call from Patient Call back at [phone]   Caller: Spouse/Brandi Reason for Call: Talk to Nurse, Talk to Doctor, Lab or Test Results Summary of Call: rtn call from yesterday to get results Initial call taken by: Omer Jack,  March 26, 2010 1:14 PM  Follow-up for Phone Call        Called patient's wife concerning lipid panel results and order to restart Crestor per Dr.Cooper. She will go to pharmacy to pick up medication.  Layne Benton, RN, BSN  March 26, 2010 1:30 PM

## 2010-04-01 LAB — BASIC METABOLIC PANEL
BUN: 4 mg/dL — ABNORMAL LOW (ref 6–23)
CO2: 25 mEq/L (ref 19–32)
Calcium: 8.7 mg/dL (ref 8.4–10.5)
Chloride: 103 mEq/L (ref 96–112)
Creatinine, Ser: 0.67 mg/dL (ref 0.4–1.5)

## 2010-04-01 LAB — CK TOTAL AND CKMB (NOT AT ARMC): Relative Index: 6.8 — ABNORMAL HIGH (ref 0.0–2.5)

## 2010-04-01 LAB — CBC
HCT: 42.4 % (ref 39.0–52.0)
Hemoglobin: 14.6 g/dL (ref 13.0–17.0)
RDW: 12.4 % (ref 11.5–15.5)
WBC: 16.6 10*3/uL — ABNORMAL HIGH (ref 4.0–10.5)

## 2010-04-01 LAB — MRSA CULTURE

## 2010-04-01 LAB — LIPID PANEL
Cholesterol: 250 mg/dL — ABNORMAL HIGH (ref 0–200)
HDL: 44 mg/dL (ref >39)
Triglycerides: 135 mg/dL (ref <150)

## 2010-04-01 LAB — TROPONIN I: Troponin I: 89.66 ng/mL (ref 0.00–0.06)

## 2010-04-02 LAB — TROPONIN I: Troponin I: 13.02 ng/mL (ref 0.00–0.06)

## 2010-04-02 LAB — CK TOTAL AND CKMB (NOT AT ARMC)
CK, MB: 56 ng/mL (ref 0.3–4.0)
Relative Index: 8 — ABNORMAL HIGH (ref 0.0–2.5)

## 2010-04-02 LAB — D-DIMER, QUANTITATIVE: D-Dimer, Quant: 1.88 ug/mL-FEU — ABNORMAL HIGH (ref 0.00–0.48)

## 2010-04-02 LAB — POCT I-STAT, CHEM 8
Calcium, Ion: 1.13 mmol/L (ref 1.12–1.32)
HCT: 46 % (ref 39.0–52.0)
TCO2: 21 mmol/L (ref 0–100)

## 2010-04-02 LAB — POCT CARDIAC MARKERS

## 2010-04-02 LAB — PROTIME-INR
INR: 0.91 (ref 0.00–1.49)
Prothrombin Time: 12.2 seconds (ref 11.6–15.2)

## 2010-04-05 LAB — BASIC METABOLIC PANEL
BUN: 7 mg/dL (ref 6–23)
BUN: 7 mg/dL (ref 6–23)
Chloride: 102 mEq/L (ref 96–112)
Chloride: 105 mEq/L (ref 96–112)
Creatinine, Ser: 0.85 mg/dL (ref 0.4–1.5)
Glucose, Bld: 115 mg/dL — ABNORMAL HIGH (ref 70–99)
Glucose, Bld: 125 mg/dL — ABNORMAL HIGH (ref 70–99)
Potassium: 4.1 mEq/L (ref 3.5–5.1)

## 2010-04-05 LAB — CBC
HCT: 44.9 % (ref 39.0–52.0)
Hemoglobin: 15.4 g/dL (ref 13.0–17.0)
MCHC: 34.3 g/dL (ref 30.0–36.0)
MCV: 92.2 fL (ref 78.0–100.0)
MCV: 92.7 fL (ref 78.0–100.0)
Platelets: 271 10*3/uL (ref 150–400)
Platelets: 279 10*3/uL (ref 150–400)
RDW: 12.7 % (ref 11.5–15.5)
WBC: 12.4 10*3/uL — ABNORMAL HIGH (ref 4.0–10.5)
WBC: 13.1 10*3/uL — ABNORMAL HIGH (ref 4.0–10.5)

## 2010-05-20 ENCOUNTER — Telehealth: Payer: Self-pay | Admitting: Cardiovascular Disease

## 2010-05-20 NOTE — Telephone Encounter (Signed)
effient needs pre auth for  [employer] [place]-uses [facility] [address]-pt out of med

## 2010-05-20 NOTE — Telephone Encounter (Signed)
I spoke with Russell Miller at the pharmacy and the pt's Effient does require prior authorization.  Per Russell Miller it looks like the insurance has changed [id number]  ID [id number].

## 2010-05-20 NOTE — Telephone Encounter (Signed)
I obtained prior authorization for this pt's Effient for the next 12 months.  I left a message at the pt's home number that he can pick up his prescription today. I left a voicemail at [facility] to run this prescription back through the system and get it filled so the pt can pick up today.

## 2010-06-29 ENCOUNTER — Other Ambulatory Visit (INDEPENDENT_AMBULATORY_CARE_PROVIDER_SITE_OTHER): Payer: BC Managed Care – PPO | Admitting: *Deleted

## 2010-06-29 DIAGNOSIS — E785 Hyperlipidemia, unspecified: Secondary | ICD-10-CM

## 2010-06-29 DIAGNOSIS — I2510 Atherosclerotic heart disease of native coronary artery without angina pectoris: Secondary | ICD-10-CM

## 2010-06-29 LAB — LIPID PANEL
Cholesterol: 143 mg/dL (ref 0–200)
VLDL: 40 mg/dL (ref 0.0–40.0)

## 2010-06-29 LAB — HEPATIC FUNCTION PANEL
ALT: 30 U/L (ref 0–53)
AST: 10 U/L (ref 0–37)
Alkaline Phosphatase: 66 U/L (ref 39–117)
Bilirubin, Direct: 0.1 mg/dL (ref 0.0–0.3)
Total Bilirubin: 0.4 mg/dL (ref 0.3–1.2)
Total Protein: 6.8 g/dL (ref 6.0–8.3)

## 2010-07-01 ENCOUNTER — Telehealth: Payer: Self-pay | Admitting: Cardiovascular Disease

## 2010-07-01 NOTE — Telephone Encounter (Signed)
Left message to call back  

## 2010-07-01 NOTE — Telephone Encounter (Signed)
Pt calling back for lab results

## 2010-07-02 NOTE — Telephone Encounter (Signed)
Test results

## 2010-07-02 NOTE — Telephone Encounter (Signed)
Left message to call back  

## 2010-08-30 ENCOUNTER — Other Ambulatory Visit: Payer: Self-pay | Admitting: Cardiovascular Disease

## 2010-08-31 NOTE — Telephone Encounter (Signed)
Pt's wife called and pt is out of med, can get called in today?

## 2010-10-15 ENCOUNTER — Other Ambulatory Visit: Payer: Self-pay | Admitting: Cardiovascular Disease

## 2010-11-20 ENCOUNTER — Other Ambulatory Visit: Payer: Self-pay | Admitting: Cardiovascular Disease

## 2010-12-30 ENCOUNTER — Other Ambulatory Visit: Payer: Self-pay | Admitting: Cardiovascular Disease

## 2011-03-02 ENCOUNTER — Other Ambulatory Visit: Payer: Self-pay | Admitting: *Deleted

## 2011-03-03 ENCOUNTER — Other Ambulatory Visit: Payer: Self-pay | Admitting: *Deleted

## 2011-03-10 ENCOUNTER — Other Ambulatory Visit: Payer: Self-pay | Admitting: *Deleted

## 2011-03-10 ENCOUNTER — Telehealth: Payer: Self-pay | Admitting: Cardiovascular Disease

## 2011-03-10 NOTE — Telephone Encounter (Signed)
New msg Pt's wife called about refill of crestor. She said he has been out of pills for 2 wks. [facility] [address]. She said pharmacy has sent numerous requests

## 2011-03-10 NOTE — Telephone Encounter (Signed)
Pt wants a refill of crestor.  His record does not show that he takes crestor.  Will forward to [name] and Leotis Shames for clarification.

## 2011-03-11 MED ORDER — ROSUVASTATIN CALCIUM 10 MG PO TABS: 10.0000 mg | ORAL_TABLET | Freq: Every day | ORAL | Status: DC

## 2011-03-11 NOTE — Telephone Encounter (Signed)
Per 03/24/10 lab results in EMR.  The pt was restarted on Crestor 10mg  daily based on lab results. The last time this pt was seen in the office was 03/24/10 for GXT. Rx sent into pharmacy for Crestor.  The pt needs an appointment scheduled with Dr Excell Seltzer. I spoke with the pt's wife and an appointment was scheduled on 04/13/11 for pt.

## 2011-03-15 ENCOUNTER — Telehealth: Payer: Self-pay | Admitting: Cardiovascular Disease

## 2011-03-15 NOTE — Telephone Encounter (Signed)
Refill F/U  Patient wife Russell Miller says RX  rosuvastatin (CRESTOR) 10 MG tablet was never received as of 03/13/11. Preferred Pharmacy verified as [facility] [address].  Patient wife can be reached at [phone] if there are any additional questions.  Summary: Take 1 tablet (10 mg total) by mouth at bedtime., Starting 03/11/2011, Until Fri 03/10/12, Normal Dose, Route, Frequency: 10 mg, Oral, Daily at bedtime  Start: 03/11/2011  End: 03/10/2012  Ord/Sold: 03/11/2011 (O)  Report  Taking:  Long-term:   [facility]armacy: [id number] [place], [place] - [address] AT [facility] [address]  Med Dose History  Change   Patient Sig: Take 1 tablet (10 mg total) by mouth at bedtime.   Ordered on: 03/11/2011   Authorized by: Tonny Bollman D   Dispense: 30 tablet

## 2011-03-16 NOTE — Telephone Encounter (Signed)
Needs prior auth for refill

## 2011-03-17 ENCOUNTER — Other Ambulatory Visit: Payer: Self-pay

## 2011-03-17 MED ORDER — ROSUVASTATIN CALCIUM 10 MG PO TABS: 10.0000 mg | ORAL_TABLET | Freq: Every day | ORAL | Status: DC

## 2011-03-17 NOTE — Telephone Encounter (Signed)
[id number] Pt ID#[id number].  I obtained prior authorization for Crestor.  This medication was approved for the next 24 months. I spoke with the pharmacy and made them aware of this information. I spoke with the pt's wife and made her aware that Crestor was approved.

## 2011-03-17 NOTE — Telephone Encounter (Signed)
..   Requested Prescriptions   Signed Prescriptions Disp Refills  . rosuvastatin (CRESTOR) 10 MG tablet 30 tablet 6    Sig: Take 1 tablet (10 mg total) by mouth at bedtime.    Authorizing Provider: Tonny Bollman D    Ordering User: Christella Hartigan, Zakiyah Diop Judie Petit

## 2011-04-07 ENCOUNTER — Encounter: Payer: Self-pay | Admitting: *Deleted

## 2011-04-13 ENCOUNTER — Ambulatory Visit: Payer: BC Managed Care – PPO | Admitting: Cardiovascular Disease

## 2011-04-16 ENCOUNTER — Other Ambulatory Visit: Payer: Self-pay | Admitting: Cardiovascular Disease

## 2011-05-13 ENCOUNTER — Encounter: Payer: Self-pay | Admitting: Cardiovascular Disease

## 2011-05-13 ENCOUNTER — Ambulatory Visit (INDEPENDENT_AMBULATORY_CARE_PROVIDER_SITE_OTHER): Payer: BC Managed Care – PPO | Admitting: Cardiovascular Disease

## 2011-05-13 VITALS — BP 110/80 | HR 54 | Ht 71.0 in | Wt 206.0 lb

## 2011-05-13 DIAGNOSIS — I2510 Atherosclerotic heart disease of native coronary artery without angina pectoris: Secondary | ICD-10-CM

## 2011-05-13 DIAGNOSIS — E785 Hyperlipidemia, unspecified: Secondary | ICD-10-CM

## 2011-05-13 MED ORDER — NITROGLYCERIN 0.4 MG SL SUBL: 0.4000 mg | SUBLINGUAL_TABLET | SUBLINGUAL | Status: DC | PRN

## 2011-05-13 NOTE — Patient Instructions (Signed)
Your physician wants you to follow-up in: 1 year with [name].  You will receive a reminder letter in the mail two months in advance. If you don't receive a letter, please call our office to schedule the follow-up appointment.  Your physician has requested that you have an exercise stress myoview. For further information please visit [email or web address] Please follow instruction sheet, as given.  Change Crestor to 5mg  on Monday, Wednesday and Friday.  Your physician recommends that you return for lab work in: 8 weeks.  Lipid/Liver.

## 2011-05-13 NOTE — Progress Notes (Signed)
   HPI:  [name] presenting for followup of coronary artery disease. The patient presented with an acute myocardial infarction in 2011. He underwent primary PCI of the left circumflex and staged PCI of severe disease in the LAD. He was treated with drug-eluting stent platformed. He presents today for followup evaluation. The patient quit smoking at the time of his infarction.  He has had a difficult time tolerating statin drugs. He has a lot of myalgias and states that some days he can barely work. He has occasional chest pain, unrelated to exertion. Yesterday he had an episode of chest pain in the left chest going to the left arm. This was distinct from the pain that he had at the time of his MI. He has not had shortness of breath, palpitations, edema, or other complaints. He's only been taking Crestor a few days per week because of tolerability.  Outpatient Encounter Prescriptions as of 05/13/2011  Medication Sig Dispense Refill  . aspirin 81 MG tablet Take 81 mg by mouth daily.      Marland Kitchen EFFIENT 10 MG TABS TAKE 1 TABLET BY MOUTH ONCE A DAY  30 tablet  0  . metoprolol succinate (TOPROL-XL) 25 MG 24 hr tablet TAKE 1 TABLET BY MOUTH DAILY  30 tablet  0  . nitroGLYCERIN (NITROSTAT) 0.4 MG SL tablet Place 0.4 mg under the tongue every 5 (five) minutes as needed.      . rosuvastatin (CRESTOR) 10 MG tablet Take 1 tablet (10 mg total) by mouth at bedtime.  30 tablet  6    No Known Allergies  Past Medical History  Diagnosis Date  . CAD (coronary artery disease) 2011    LCx infarction treated with DES and staged PCI of severe prox LAD stenosis treated with  DES.  . Ischemic cardiomyopathy     mild, with LVEF greater than 45%  . Hyperlipidemia   . Hyperglycemia   . Tobacco abuse     ROS: Negative except as per HPI  BP 110/80  Pulse 54  Ht 5\' 11"  (1.803 m)  Wt 93.441 kg (206 lb)  BMI 28.73 kg/m2  PHYSICAL EXAM: Pt is alert and oriented, NAD HEENT: normal Neck: JVP -  normal, carotids 2+= without bruits Lungs: CTA bilaterally CV: Bradycardic and regular without murmur or gallop Abd: soft, NT, Positive BS, no hepatomegaly Ext: no C/C/E, distal pulses intact and equal Skin: warm/dry no rash  EKG:  Sinus bradycardia 54 beats per minute, otherwise within normal limits  ASSESSMENT AND PLAN: 1. Coronary artery disease. The patient has chest pain with typical and atypical features in the setting of known CAD. I have recommended an exercise stress Myoview scan to rule out significant ischemia. He will continue on his current medical regime.  2. Dyslipidemia. I am going to reduce his Crestor to 5 mg 3 days weekly and I would like to repeat lipids and LFTs in about 2 months.   3. Followup. I like to see the patient back in 12 months for routine followup. If his chest pain worsens he will notify us. We will review his stress results when available. If his Myoview scan is negative for ischemia, I would be inclined to stop his effient as he is greater than 2 years out from his MI.  Tonny Bollman 05/13/2011

## 2011-05-25 ENCOUNTER — Encounter (HOSPITAL_COMMUNITY): Payer: BC Managed Care – PPO

## 2011-06-01 ENCOUNTER — Other Ambulatory Visit: Payer: Self-pay | Admitting: Cardiovascular Disease

## 2011-06-03 ENCOUNTER — Telehealth: Payer: Self-pay | Admitting: Cardiovascular Disease

## 2011-06-03 NOTE — Telephone Encounter (Signed)
F/u Refill- EFFIENT 10 MG TABS   Patient was told there was a problem with this prescription, Med notes show RX was sent out yesterday to verified preferred [facility].   Please verify RX was filled by pharmacy . Patient would like a return call to advise.  Patient wife brandy can be reached at [phone].

## 2011-06-08 ENCOUNTER — Ambulatory Visit (HOSPITAL_COMMUNITY): Payer: BC Managed Care – PPO | Attending: Cardiovascular Disease | Admitting: Radiology

## 2011-06-08 VITALS — BP 127/86 | HR 62 | Ht 71.0 in | Wt 200.0 lb

## 2011-06-08 DIAGNOSIS — R5383 Other fatigue: Secondary | ICD-10-CM | POA: Insufficient documentation

## 2011-06-08 DIAGNOSIS — R42 Dizziness and giddiness: Secondary | ICD-10-CM | POA: Insufficient documentation

## 2011-06-08 DIAGNOSIS — R0789 Other chest pain: Secondary | ICD-10-CM | POA: Insufficient documentation

## 2011-06-08 DIAGNOSIS — E785 Hyperlipidemia, unspecified: Secondary | ICD-10-CM | POA: Insufficient documentation

## 2011-06-08 DIAGNOSIS — R079 Chest pain, unspecified: Secondary | ICD-10-CM

## 2011-06-08 DIAGNOSIS — I252 Old myocardial infarction: Secondary | ICD-10-CM | POA: Insufficient documentation

## 2011-06-08 DIAGNOSIS — I4949 Other premature depolarization: Secondary | ICD-10-CM

## 2011-06-08 DIAGNOSIS — R5381 Other malaise: Secondary | ICD-10-CM | POA: Insufficient documentation

## 2011-06-08 DIAGNOSIS — Z87891 Personal history of nicotine dependence: Secondary | ICD-10-CM | POA: Insufficient documentation

## 2011-06-08 DIAGNOSIS — I2510 Atherosclerotic heart disease of native coronary artery without angina pectoris: Secondary | ICD-10-CM | POA: Insufficient documentation

## 2011-06-08 MED ORDER — TECHNETIUM TC 99M TETROFOSMIN IV KIT
11.0000 | PACK | Freq: Once | INTRAVENOUS | Status: AC | PRN
  Administered 2011-06-08: 11 via INTRAVENOUS

## 2011-06-08 MED ORDER — TECHNETIUM TC 99M TETROFOSMIN IV KIT
33.0000 | PACK | Freq: Once | INTRAVENOUS | Status: AC | PRN
  Administered 2011-06-08: 33 via INTRAVENOUS

## 2011-06-08 NOTE — Progress Notes (Signed)
[facility] SITE 3 NUCLEAR MED [address] [place] [address] [phone]  Cardiology Nuclear Med Study  Russell Miller is a 41 y.o. male     MRN : [id number]     DOB: 1970/05/01  Procedure Date: 06/08/2011  Nuclear Med Background Indication for Stress Test:  Evaluation for Ischemia and Stent Patency History:  '11 NSTEMI>Stent x 2 Cardiac Risk Factors: History of Smoking and Lipids  Symptoms:  Chest Pain/ Tightness with Exertion (last episode of chest discomfort was about 3-weeks ago), Dizziness and Fatigue   Nuclear Pre-Procedure Caffeine/Decaff Intake:  None NPO After: 9:00pm   Lungs:  Clear. IV 0.9% NS with Angio Cath:  20g  IV Site: R Antecubital  IV Started by:  Stanton Kidney, EMT-P  Chest Size (in):  42 Cup Size: n/a  Height: 5\' 11"  (1.803 m)  Weight:  200 lb (90.719 kg)  BMI:  Body mass index is 27.89 kg/(m^2). Tech Comments:  Metoprolol held > 36 hours, per patient.    Nuclear Med Study 1 or 2 day study: 1 day  Stress Test Type:  Stress  Reading MD: Cassell Clement, MD  Order Authorizing Provider:  Tonny Bollman, MD  Resting Radionuclide: Technetium 41m Tetrofosmin  Resting Radionuclide Dose: 10.9 mCi   Stress Radionuclide:  Technetium 42m Tetrofosmin  Stress Radionuclide Dose: 31.8 mCi           Stress Protocol Rest HR: 62 Stress HR: 171  Rest BP: 127/86 Stress BP: 180/84  Exercise Time (min): 11:01 METS: 13.4   Predicted Max HR: 180 bpm % Max HR: 95 bpm Rate Pressure Product: 21308   Dose of Adenosine (mg):  n/a Dose of Lexiscan: n/a mg  Dose of Atropine (mg): n/a Dose of Dobutamine: n/a mcg/kg/min (at max HR)  Stress Test Technologist: Smiley Houseman, CMA-N  Nuclear Technologist:  Domenic Polite, CNMT     Rest Procedure:  Myocardial perfusion imaging was performed at rest 45 minutes following the intravenous administration of Technetium 69m Tetrofosmin.  Rest ECG: Early repolarization.  Stress Procedure:  The patient  exercised on the treadmill utilizing the Bruce protocol for 11:01 minutes. He then stopped due to fatigue and denied any chest pain.  There were no diagnostic ST-T wave changes, occasional PVC's were noted.  Technetium 56m Tetrofosmin was injected at peak exercise and myocardial perfusion imaging was performed after a brief delay.  Stress ECG: No significant change from baseline ECG  QPS Raw Data Images:  Normal; no motion artifact; normal heart/lung ratio. Stress Images:  There is decreased uptake in the lateral wall. Rest Images:  There is decreased uptake in the lateral wall. Subtraction (SDS):  There is a fixed defect that is most consistent with a previous infarction. Transient Ischemic Dilatation (Normal <1.22):  1.00 Lung/Heart Ratio (Normal <0.45):  0.37  Quantitative Gated Spect Images QGS EDV:  157 ml QGS ESV:  84 ml  Impression Exercise Capacity:  Excellent exercise capacity. BP Response:  Normal blood pressure response. Clinical Symptoms:  No significant symptoms noted. ECG Impression:  No significant ST segment change suggestive of ischemia. Comparison with Prior Nuclear Study: No previous nuclear study performed  Overall Impression:  Low risk stress nuclear study. There is a medium-sized scar involving the basal inferolateral and mid inferolateral myocardium.  There is no reversible ischemia seen.  LV Ejection Fraction: 46%.  LV Wall Motion:  There is lateral wall hypokinesis.  Cassell Clement

## 2011-06-11 ENCOUNTER — Other Ambulatory Visit: Payer: Self-pay | Admitting: *Deleted

## 2011-06-20 ENCOUNTER — Other Ambulatory Visit: Payer: Self-pay | Admitting: Cardiovascular Disease

## 2011-07-08 ENCOUNTER — Other Ambulatory Visit: Payer: BC Managed Care – PPO

## 2011-09-19 ENCOUNTER — Other Ambulatory Visit: Payer: Self-pay | Admitting: Cardiovascular Disease

## 2011-12-27 IMAGING — CT CT ANGIO PELVIS
2 of 7 series · 14 of 46 positions shown, 18 images · IV contrast (APPLIED)
Comparison: None.

CTA CHEST

CLINICAL DATA: Severe chest pain radiating to back and arms.
Nausea.  Clinical suspicion for aortic dissection.

CT ANGIOGRAPHY CHEST, ABDOMEN AND PELVIS
TECHNIQUE: Multidetector CT imaging through the chest, abdomen and
pelvis was performed using the standard protocol during bolus
administration of intravenous contrast.  Multiplanar reconstructed
images including MIPs were obtained and reviewed to evaluate the
vascular anatomy.
Contrast: 125 ml Imnipaque-MPP

[Series 5: arterial · axial · arterial · 0.74mm/px · z∈[-698,-106]mm · 11 of 227 slices shown, 15 images]
[im 15/227  soft-tissue]
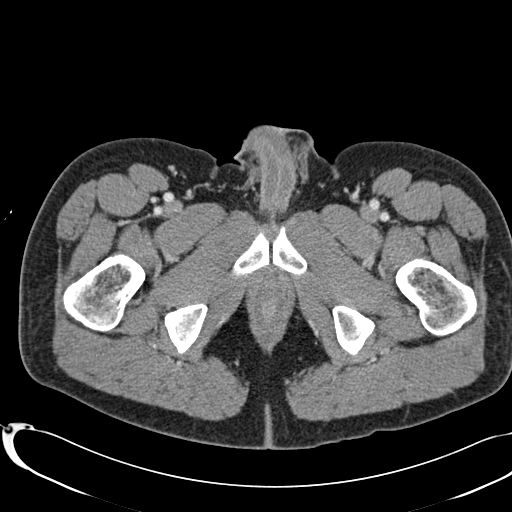
[im 15/227  bone]
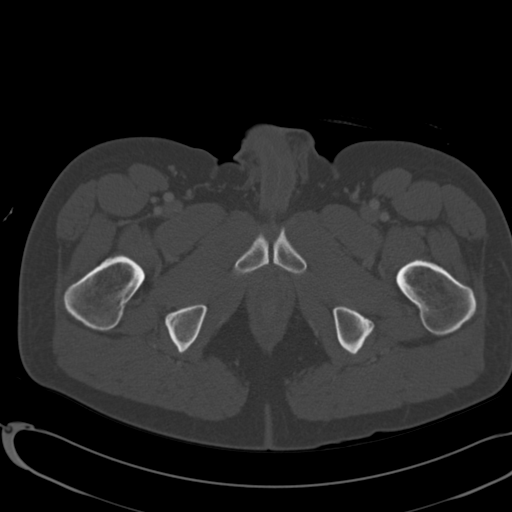
[im 43/227  soft-tissue]
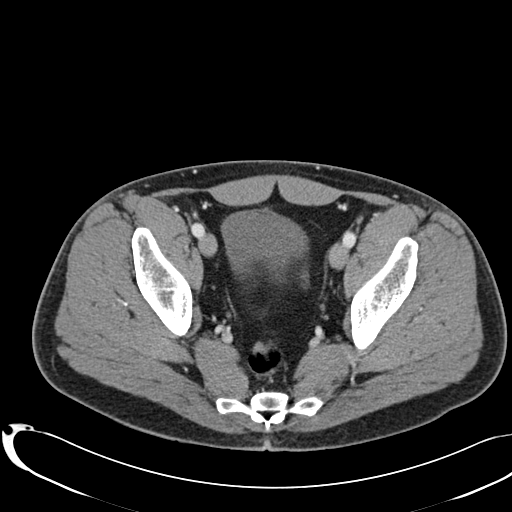
[im 71/227  soft-tissue]
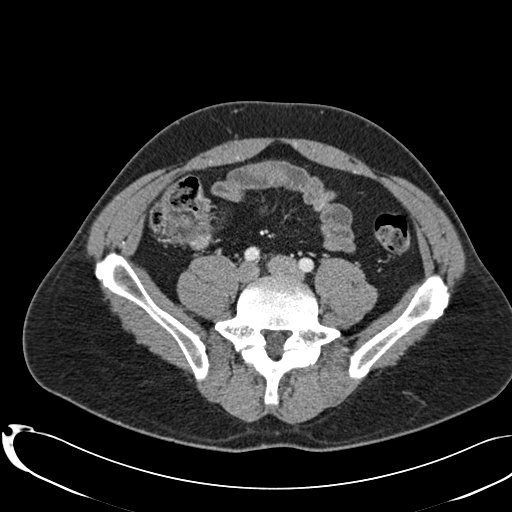
[im 85/227  soft-tissue]
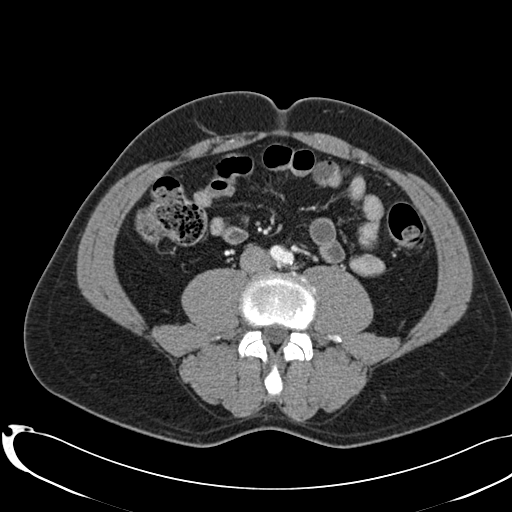
[im 114/227  soft-tissue]
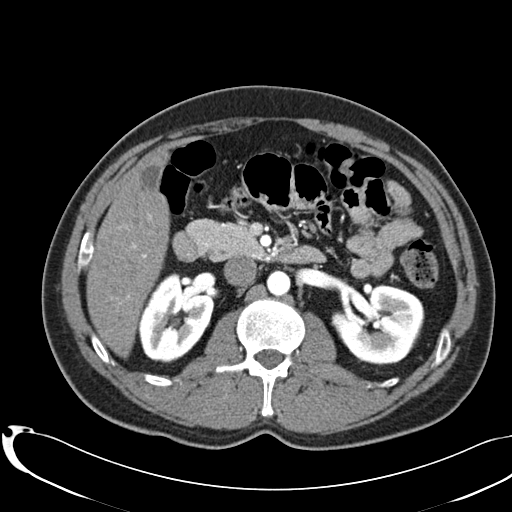
[im 142/227  soft-tissue]
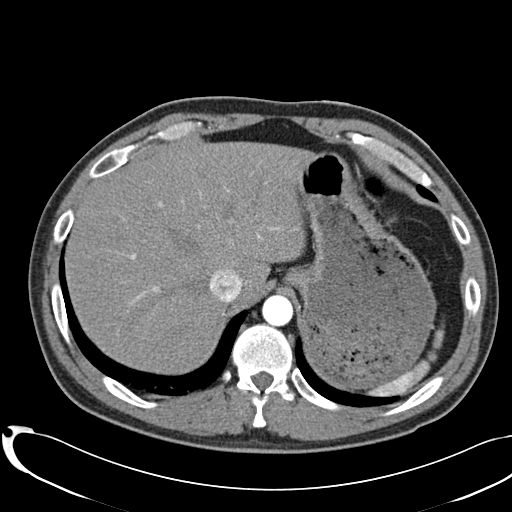
[im 156/227  soft-tissue]
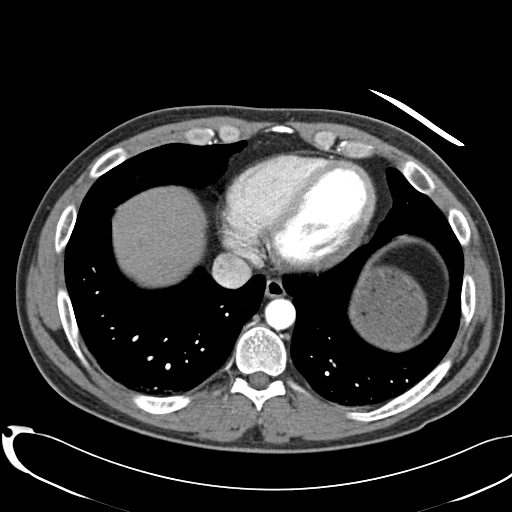
[im 170/227  lung]
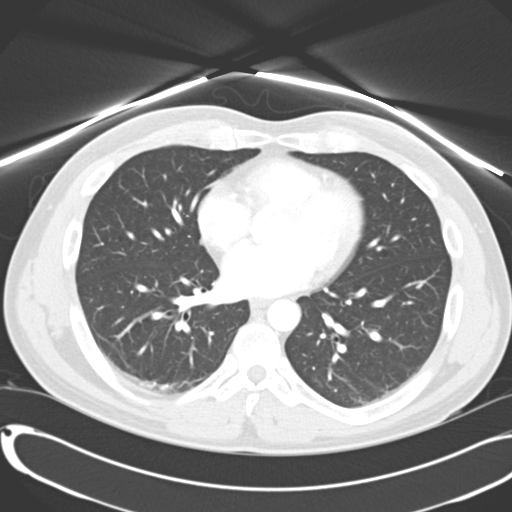
[im 184/227  soft-tissue]
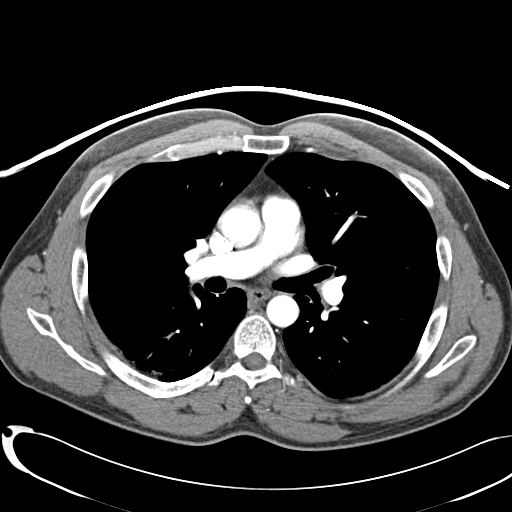
[im 184/227  lung]
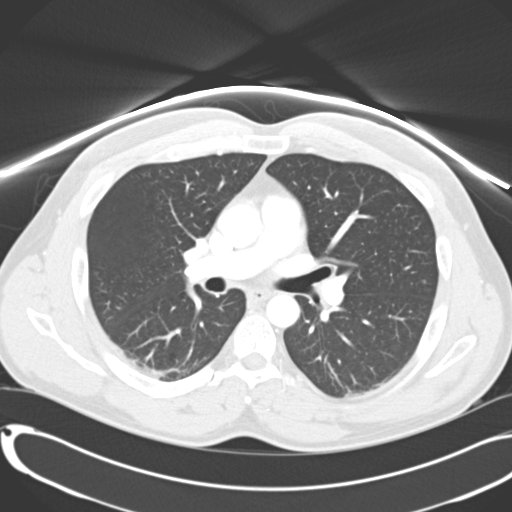
[im 198/227  lung]
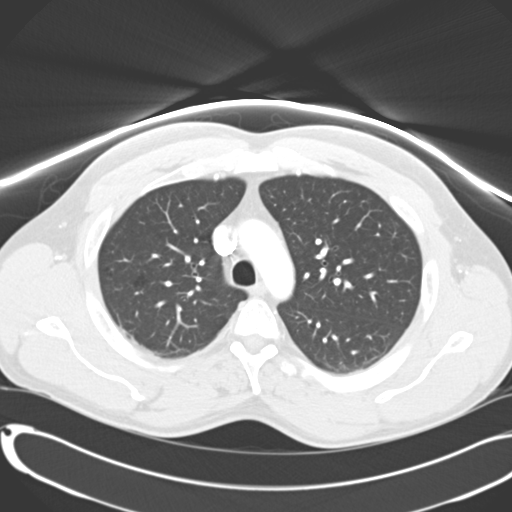
[im 212/227  soft-tissue]
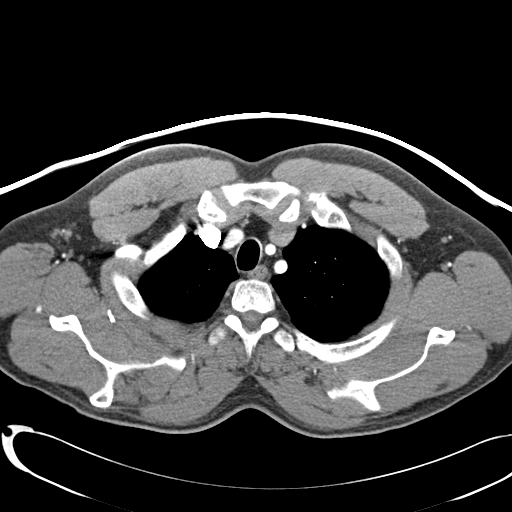
[im 212/227  lung]
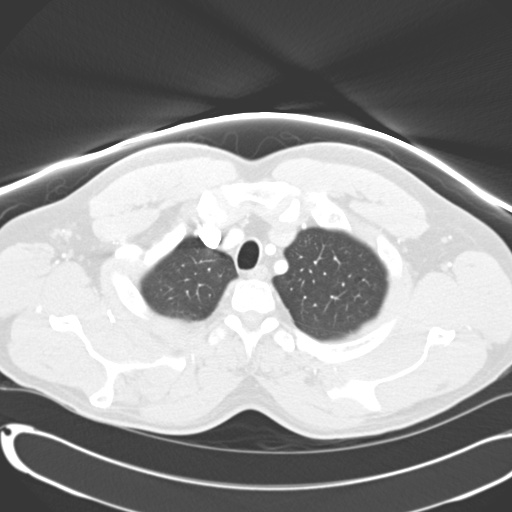
[im 212/227  bone]
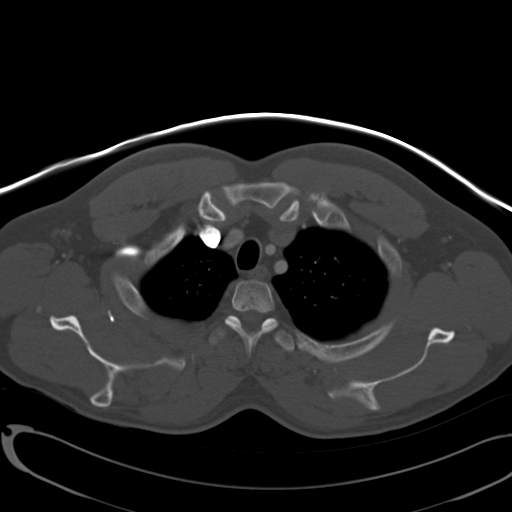

[Series 603: coronal cap · coronal · 1.33mm/px · 3 of 128 slices shown]
[im 32/128  soft-tissue]
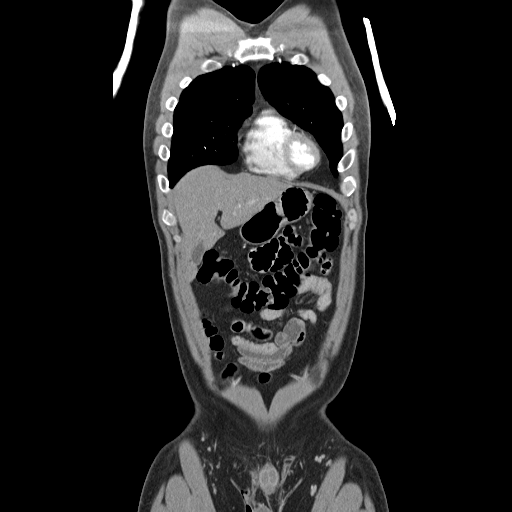
[im 64/128  soft-tissue]
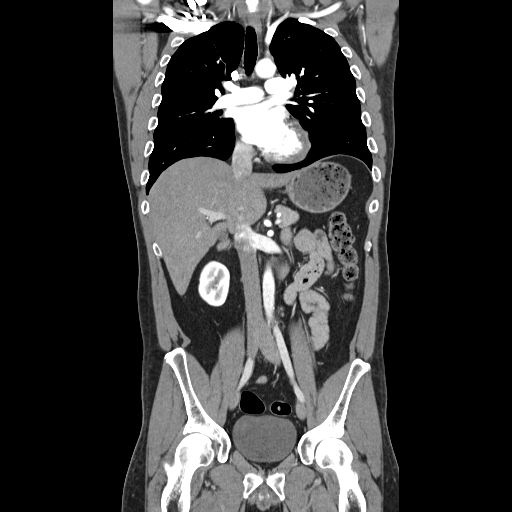
[im 96/128  soft-tissue]
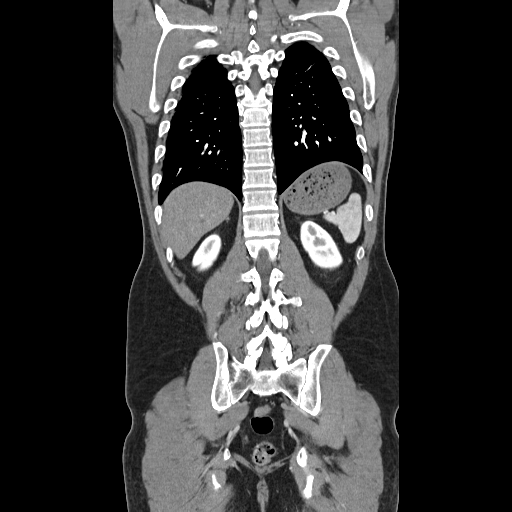

[14 of 46 positions shown; findings below may reference images not displayed]

FINDINGS: No evidence of thoracic aortic aneurysm or dissection.
No evidence of atherosclerotic plaque.  No evidence of mediastinal
hematoma or mass. Pulmonary arteries are well also well opacified
and no acute pulmonary emboli identified.

No adenopathy identified within the thorax.  No evidence of pleural
or pericardial effusion.  Mild dependent atelectasis noted
bilaterally, without evidence of pulmonary infiltrate or mass.

 Review of the MIP images confirms the above findings.
IMPRESSION: No acute findings.  No evidence of thoracic aortic aneurysm or
dissection.

CTA ABDOMEN
FINDINGS: There is no evidence of abdominal aortic aneurysm or
dissection.  No evidence of atherosclerotic plaque.  Proximal major
visceral arteries appear normal. No evidence of retroperitoneal
hemorrhage.

The abdominal parenchymal organs are normal appearance.  There is
no evidence of abdominal soft tissue mass or lymphadenopathy.  No
evidence of inflammatory process or abnormal fluid collections.
Bowel loops appear normal.

 Review of the MIP images confirms the above findings.
IMPRESSION: No acute findings.  No evidence of abdominal aortic aneurysm or
dissection.

CTA PELVIS
FINDINGS: There is no evidence of iliac artery aneurysm or
dissection.  No evidence of pelvic or retroperitoneal hematoma.
Minimal atherosclerotic calcification noted in both common iliac
arteries.

No evidence of pelvic mass or lymphadenopathy.  No inflammatory
process or abnormal fluid collections are identified.

 Review of the MIP images confirms the above findings.
IMPRESSION: No acute findings.  No evidence of iliac artery aneurysm or
dissection.

## 2012-06-20 ENCOUNTER — Ambulatory Visit (INDEPENDENT_AMBULATORY_CARE_PROVIDER_SITE_OTHER): Payer: BC Managed Care – PPO | Admitting: Cardiovascular Disease

## 2012-06-20 ENCOUNTER — Encounter: Payer: Self-pay | Admitting: Cardiovascular Disease

## 2012-06-20 VITALS — BP 138/98 | HR 60 | Ht 71.0 in | Wt 200.0 lb

## 2012-06-20 DIAGNOSIS — E785 Hyperlipidemia, unspecified: Secondary | ICD-10-CM

## 2012-06-20 DIAGNOSIS — I2510 Atherosclerotic heart disease of native coronary artery without angina pectoris: Secondary | ICD-10-CM

## 2012-06-20 NOTE — Patient Instructions (Addendum)
Your physician recommends that you return for a FASTING LIPID and LIVER profile in 3 MONTHS--nothing to eat or drink after midnight, lab opens at 7:30  Your physician wants you to follow-up in: 1 YEAR with Dr Excell Seltzer.  You will receive a reminder letter in the mail two months in advance. If you don't receive a letter, please call our office to schedule the follow-up appointment.  Your physician recommends that you continue on your current medications as directed. Please refer to the Current Medication list given to you today.

## 2012-06-20 NOTE — Progress Notes (Signed)
HPI:  42 year old gentleman presenting for followup evaluation. The patient has coronary artery disease and he presented with an acute MI in 2011. He underwent primary PCI the left circumflex and staged PCI of the LAD. He was treated with drug-eluting stents. He's also had hyperlipidemia but is limited by statin intolerance. He had a nuclear scan with a low risk result last year. This demonstrated inferolateral scar without ischemia.  He's been having a lot of trouble with neck and back pain recently. He is experiencing pain in the left neck down the left arm, especially with certain movements of his head or arm. This is a severe, sharp pain. She's been worried that it has some similarities to his heart attack. He's had no chest pain or pressure with this. He is able to work hard and break a good sweat without any chest discomfort or dyspnea. He denies palpitations, lightheadedness, or syncope. He complains of muscle cramps and spasms. He is back off of Crestor again.  Outpatient Encounter Prescriptions as of 06/20/2012  Medication Sig Dispense Refill  . aspirin 81 MG tablet Take 81 mg by mouth daily.      . metoprolol succinate (TOPROL-XL) 25 MG 24 hr tablet TAKE 1 TABLET BY MOUTH DAILY  30 tablet  9  . nitroGLYCERIN (NITROSTAT) 0.4 MG SL tablet Place 1 tablet (0.4 mg total) under the tongue every 5 (five) minutes as needed.  25 tablet  5  . rosuvastatin (CRESTOR) 10 MG tablet Take 5mg  on Monday, Wednesday and Friday only.  30 tablet  6  . [DISCONTINUED] HYDROcodone-acetaminophen (NORCO/VICODIN) 5-325 MG per tablet       . [DISCONTINUED] ketoprofen (ORUDIS) 75 MG capsule       . [DISCONTINUED] tiZANidine (ZANAFLEX) 4 MG tablet        No facility-administered encounter medications on file as of 06/20/2012.    No Known Allergies  Past Medical History  Diagnosis Date  . CAD (coronary artery disease) 2011    LCx infarction treated with DES and staged PCI of severe prox LAD stenosis treated with   DES.  . Ischemic cardiomyopathy     mild, with LVEF greater than 45%  . Hyperlipidemia   . Hyperglycemia   . Tobacco abuse     ROS: Negative except as per HPI  BP 138/98  Pulse 60  Ht 5\' 11"  (1.803 m)  Wt 90.719 kg (200 lb)  BMI 27.91 kg/m2  PHYSICAL EXAM: Pt is alert and oriented, NAD HEENT: normal Neck: JVP - normal, carotids 2+= without bruits Lungs: CTA bilaterally CV: RRR without murmur or gallop Abd: soft, NT, Positive BS, no hepatomegaly Ext: no C/C/E, distal pulses intact and equal Skin: warm/dry no rash  EKG:  Normal sinus rhythm 60 beats per minute, within normal limits.  Stress nuclear scan Jun 08 2011: QPS  Raw Data Images: Normal; no motion artifact; normal heart/lung ratio.  Stress Images: There is decreased uptake in the lateral wall.  Rest Images: There is decreased uptake in the lateral wall.  Subtraction (SDS): There is a fixed defect that is most consistent with a previous infarction.  Transient Ischemic Dilatation (Normal <1.22): 1.00  Lung/Heart Ratio (Normal <0.45): 0.37  Quantitative Gated Spect Images  QGS EDV: 157 ml  QGS ESV: 84 ml  Impression  Exercise Capacity: Excellent exercise capacity.  BP Response: Normal blood pressure response.  Clinical Symptoms: No significant symptoms noted.  ECG Impression: No significant ST segment change suggestive of ischemia.  Comparison with Prior Nuclear  Study: No previous nuclear study performed  Overall Impression: Low risk stress nuclear study. There is a medium-sized scar involving the basal inferolateral and mid inferolateral myocardium. There is no reversible ischemia seen.  LV Ejection Fraction: 46%. LV Wall Motion: There is lateral wall hypokinesis.  ASSESSMENT AND PLAN: 1. Coronary artery disease, native vessel. The patient appears stable. His neck pain is highly suggestive of a musculoskeletal or disc problem. It does not sound cardiac related. He will continue aspirin and a beta blocker. His  stress test last year showed no ischemia.  2. Hypertension. I suspect blood pressure is elevated today because of his pain. I didn't make any changes to his medical program.  3. Hyperlipidemia. He's had difficulty with statin intolerance. Advised him to start back on Crestor 5 mg every other day when his neck problems are improved. Will repeat lipids and LFTs in about 3 months. He hasn't taken cholesterol medicine in the past few weeks.  Tonny Bollman 06/20/2012 10:46 AM

## 2012-09-26 ENCOUNTER — Other Ambulatory Visit: Payer: BC Managed Care – PPO

## 2012-10-02 ENCOUNTER — Other Ambulatory Visit: Payer: Self-pay | Admitting: Cardiovascular Disease

## 2012-10-09 ENCOUNTER — Other Ambulatory Visit: Payer: BC Managed Care – PPO

## 2012-12-21 ENCOUNTER — Other Ambulatory Visit: Payer: Self-pay | Admitting: Cardiovascular Disease

## 2013-07-25 ENCOUNTER — Ambulatory Visit (INDEPENDENT_AMBULATORY_CARE_PROVIDER_SITE_OTHER): Payer: BC Managed Care – PPO | Admitting: Physician Assistant

## 2013-07-25 ENCOUNTER — Encounter: Payer: Self-pay | Admitting: Physician Assistant

## 2013-07-25 VITALS — BP 100/70 | HR 69 | Ht 71.0 in | Wt 201.0 lb

## 2013-07-25 DIAGNOSIS — E785 Hyperlipidemia, unspecified: Secondary | ICD-10-CM

## 2013-07-25 DIAGNOSIS — I10 Essential (primary) hypertension: Secondary | ICD-10-CM

## 2013-07-25 DIAGNOSIS — I2510 Atherosclerotic heart disease of native coronary artery without angina pectoris: Secondary | ICD-10-CM

## 2013-07-25 MED ORDER — NITROGLYCERIN 0.4 MG SL SUBL: 0.4000 mg | SUBLINGUAL_TABLET | SUBLINGUAL | Status: DC | PRN

## 2013-07-25 NOTE — Patient Instructions (Signed)
Your physician recommends that you continue on your current medications as directed. Please refer to the Current Medication list given to you today.  Your physician recommends that you return for lab work for Fasting Lipid Panel  After Lipid Panel is completed [facility] both before leaving today)  Your physician wants you to follow-up in: 1 year with Dr Theodoro Parmaooper You will receive a reminder letter in the mail two months in advance. If you don't receive a letter, please call our office to schedule the follow-up appointment.

## 2013-07-25 NOTE — Progress Notes (Signed)
Cardiology Office Note    Date:  07/25/2013   ID:  Russell Miller, DOB 1970/10/09, MRN [id number]  PCP:  Sanda Linger, MD  Cardiologist:  Dr. Tonny Bollman      History of Present Illness: Russell Miller is a 43 y.o. male with a hx of CAD s/p acute MI in 2011 treated with primary PCI of CFX and staged PCI of the LAD (DES), HL, HTN.  He is statin intolerant. Last seen by Dr. Tonny Bollman in 06/2012.  He is doing well.  The patient denies chest pain, shortness of breath, syncope, orthopnea, PND or significant pedal edema.  He cannot take Crestor due to myalgias.    Studies:  - STEMI >>> LHC (02/2009):  Dist RCA 40-50%, prox D1 70%, LAD 80% then 30-40%, prox CFX occluded, EF 50%, inferolat AK  >>> PCI: DES to CFX/OM bifurcation and PTCA to mid CFX >>> staged PCI:  3 x 28 mm Promus DES to mid LAD  Myoview (06/08/11): Overall Impression: Low risk stress nuclear study. There is a medium-sized scar involving the basal inferolateral and mid inferolateral myocardium. There is no reversible ischemia seen.  LV Ejection Fraction: 46%. LV Wall Motion: There is lateral wall hypokinesis.   Recent Labs: No results found for requested labs within last 365 days.  Wt Readings from Last 3 Encounters:  07/25/13 201 lb (91.173 kg)  06/20/12 200 lb (90.719 kg)  06/08/11 200 lb (90.719 kg)     Past Medical History  Diagnosis Date  . CAD (coronary artery disease) 2011    LCx infarction treated with DES and staged PCI of severe prox LAD stenosis treated with  DES.  . Ischemic cardiomyopathy     mild, with LVEF greater than 45%  . Hyperlipidemia   . Hyperglycemia   . Tobacco abuse     Current Outpatient Prescriptions  Medication Sig Dispense Refill  . aspirin 81 MG tablet Take 81 mg by mouth daily.      . metoprolol succinate (TOPROL-XL) 25 MG 24 hr tablet TAKE 1 TABLET BY MOUTH DAILY  30 tablet  5  . nitroGLYCERIN (NITROSTAT) 0.4 MG SL tablet Place 1 tablet (0.4 mg total) under the tongue  every 5 (five) minutes as needed.  25 tablet  5  . rosuvastatin (CRESTOR) 10 MG tablet Take 5mg  on Monday, Wednesday and Friday only.  30 tablet  6   No current facility-administered medications for this visit.    Allergies:   Review of patient's allergies indicates no known allergies.   Social History:  The patient  reports that he quit smoking about 4 years ago. He does not have any smokeless tobacco history on file. He reports that he does not drink alcohol or use illicit drugs.   Family History:  The patient's family history includes Cancer in his cousin; Diabetes in his maternal grandmother; Heart attack in his maternal grandmother and mother; Hyperlipidemia in his mother; Hypertension in his mother; Stroke in his maternal grandmother.   ROS:  Please see the history of present illness.      All other systems reviewed and negative.   PHYSICAL EXAM: VS:  BP 100/70  Pulse 69  Ht 5\' 11"  (1.803 m)  Wt 201 lb (91.173 kg)  BMI 28.05 kg/m2 Well nourished, well developed, in no acute distress HEENT: normal Neck: no JVD Vascular:  No carotid bruits Cardiac:  normal S1, S2; RRR; no murmur Lungs:  clear to auscultation bilaterally, no wheezing, rhonchi or rales  Abd: soft, nontender, no hepatomegaly Ext: no edema Skin: warm and dry Neuro:  CNs 2-12 intact, no focal abnormalities noted  EKG:  NSR, HR 69, normal axis, inferior Q waves, no significant change when compared to prior tracing     ASSESSMENT AND PLAN:  1. Atherosclerosis of native coronary artery of native heart without angina pectoris:  No angina.  Continue ASA, beta blocker.  He cannot tolerate statins. 2. HYPERLIPIDEMIA:  He is not taking Crestor.  Check Fasting Lipids.  Refer to the Lipid Clinic. 3. Essential hypertension:  BP controlled on low dose beta blocker. 4. Disposition:  F/u with Dr. Tonny BollmanMichael Miller in 1 year.    Signed, Russell RimScott Weaver, PA-C, MHS 07/25/2013 8:47 AM     [facility] [address], [place], [place]  [address] Phone: [phone]; Fax: [phone]

## 2013-07-30 ENCOUNTER — Other Ambulatory Visit: Payer: BC Managed Care – PPO

## 2013-08-07 ENCOUNTER — Ambulatory Visit: Payer: BC Managed Care – PPO | Admitting: Pharmacist

## 2013-11-27 ENCOUNTER — Encounter: Payer: Self-pay | Admitting: Cardiovascular Disease

## 2014-01-14 ENCOUNTER — Other Ambulatory Visit (HOSPITAL_COMMUNITY): Payer: Self-pay | Admitting: Cardiovascular Disease

## 2014-03-11 ENCOUNTER — Ambulatory Visit (INDEPENDENT_AMBULATORY_CARE_PROVIDER_SITE_OTHER): Payer: BLUE CROSS/BLUE SHIELD | Admitting: Family Medicine

## 2014-03-11 ENCOUNTER — Ambulatory Visit (INDEPENDENT_AMBULATORY_CARE_PROVIDER_SITE_OTHER): Payer: BLUE CROSS/BLUE SHIELD

## 2014-03-11 VITALS — BP 140/88 | HR 64 | Temp 97.5°F | Resp 16 | Ht 70.0 in | Wt 205.7 lb

## 2014-03-11 DIAGNOSIS — T148 Other injury of unspecified body region: Secondary | ICD-10-CM

## 2014-03-11 DIAGNOSIS — L0291 Cutaneous abscess, unspecified: Secondary | ICD-10-CM

## 2014-03-11 DIAGNOSIS — W57XXXA Bitten or stung by nonvenomous insect and other nonvenomous arthropods, initial encounter: Secondary | ICD-10-CM

## 2014-03-11 DIAGNOSIS — S52201K Unspecified fracture of shaft of right ulna, subsequent encounter for closed fracture with nonunion: Secondary | ICD-10-CM

## 2014-03-11 DIAGNOSIS — R2231 Localized swelling, mass and lump, right upper limb: Secondary | ICD-10-CM | POA: Diagnosis not present

## 2014-03-11 LAB — POCT CBC
GRANULOCYTE PERCENT: 54 % (ref 37–80)
HEMATOCRIT: 45.7 % (ref 43.5–53.7)
HEMOGLOBIN: 14.7 g/dL (ref 14.1–18.1)
LYMPH, POC: 3.4 (ref 0.6–3.4)
MCH, POC: 29.6 pg (ref 27–31.2)
MCHC: 32.3 g/dL (ref 31.8–35.4)
MCV: 91.6 fL (ref 80–97)
MID (cbc): 0.6 (ref 0–0.9)
MPV: 6.5 fL (ref 0–99.8)
POC Granulocyte: 4.7 (ref 2–6.9)
POC LYMPH PERCENT: 39.2 %L (ref 10–50)
POC MID %: 6.8 %M (ref 0–12)
Platelet Count, POC: 267 10*3/uL (ref 142–424)
RBC: 4.99 M/uL (ref 4.69–6.13)
RDW, POC: 13.4 %
WBC: 8.7 10*3/uL (ref 4.6–10.2)

## 2014-03-11 LAB — POCT SEDIMENTATION RATE: POCT SED RATE: 5 mm/hr (ref 0–22)

## 2014-03-11 MED ORDER — MELOXICAM 7.5 MG PO TABS: 7.5000 mg | ORAL_TABLET | Freq: Every day | ORAL | Status: DC

## 2014-03-11 MED ORDER — DOXYCYCLINE HYCLATE 100 MG PO CAPS: 100.0000 mg | ORAL_CAPSULE | Freq: Two times a day (BID) | ORAL | Status: DC

## 2014-03-11 NOTE — Patient Instructions (Signed)
Spider Bite Spider bites are not common. Most spider bites do not cause serious problems. The elderly, very young children, and people with certain existing medical conditions are more likely to experience significant symptoms. SYMPTOMS  Spider bites may not cause any pain at first. Within 1 or 2 days of the bite, there may be swelling, redness, and pain in the bite area. However, some spider bites can cause pain within the first hour. TREATMENT  Your caregiver may prescribe antibiotic medicine if a bacterial infection develops in the bite. However, not all spider bites require antibiotics or prescription medicines.  HOME CARE INSTRUCTIONS  Do not scratch the bite area.  Keep the bite area clean and dry. Wash the area with soap and water as directed.  Put ice or cool compresses on the bite area.  Put ice in a plastic bag.  Place a towel between your skin and the bag.  Leave the ice on for 20 minutes, 4 times a day for the first 2 to 3 days, or as directed.  Keep the bite area elevated above the level of your heart. This helps reduce redness and swelling.  Only take over-the-counter or prescription medicines as directed by your caregiver.  If you are given antibiotics, take them as directed. Finish them even if you start to feel better. You may need a tetanus shot if:  You cannot remember when you had your last tetanus shot.  You have never had a tetanus shot.  The injury broke your skin. If you get a tetanus shot, your arm may swell, get red, and feel warm to the touch. This is common and not a problem. If you need a tetanus shot and you choose not to have one, there is a rare chance of getting tetanus. Sickness from tetanus can be serious. SEEK MEDICAL CARE IF: Your bite is not better after 3 days of treatment. SEEK IMMEDIATE MEDICAL CARE IF:  Your bite turns purple or develops increased swelling, pain, or redness.  You develop shortness of breath or chest pain.  You have  muscle cramps or painful muscle spasms.  You develop abdominal pain, nausea, or vomiting.  You feel unusually tired or sleepy. MAKE SURE YOU:  Understand these instructions.  Will watch your condition.  Will get help right away if you are not doing well or get worse. Document Released: 02/05/2004 Document Revised: 03/22/2011 Document Reviewed: 07/29/2010 [facility] Patient Information 2015 [place], [place]. This information is not intended to replace advice given to you by your health care provider. Make sure you discuss any questions you have with your health care provider.   Ulna Fracture with Rehab The ulna is the bone on the little finger side of the forearm. An ulna fracture is a complete or partial break (fracture) in the ulna. Ulna fractures may include the wrist or elbow joints, although this document does not discuss those injuries. SYMPTOMS   Severe pain over the fracture site at the time of injury.  Tenderness, inflammation, and/or bruising over the fracture site (contusion).  Visible deformity if the bone fragments are not properly aligned (the fracture is displaced).  Signs of nerve or vascular damage: numbness, coldness, or paralysis below the fracture (uncommon).  A crackling sound (crepitus) when you try to touch the fractured area. CAUSES  Ulna fractures Occur when a force is placed on the ulna that is greater than the bone can withstand. Common mechanisms of injury include:  Direct trauma to the ulna.  Falling on an outstretched  hand.  Violent muscle contraction. RISK INCREASES WITH:  Contact sports (football, rugby, soccer, martial arts, lacrosse, or hockey).  Bone disease (osteoporosis or bone tumor).  Previous injury that resulted in immobilization of the forearm.  Poor strength and flexibility. PREVENTION  Warm up and stretch properly before any activity.  Maintain physical fitness:  Strength, flexibility, and endurance.  Cardiovascular  fitness.  Wear properly fitted and padded protective equipment. PROGNOSIS  If treated properly, then ulna fractures typically heal in 6 to 8 weeks for adults and 4 to 6 weeks in children.  RELATED COMPLICATIONS   Failure of the fracture to heal (nonunion).  Healing of the fracture in a poor position (malunion).  Arthritis of the joint or elbow.  Prolonged healing time, if improperly treated or re-injured.  Calcium salts building up in the soft tissue in the forearm (calcification).  Nerve or vascular damage.  Bone shortening.  Loss or motion of the elbow or wrist. TREATMENT Treatment initially involves the use of ice and medicine to help reduce pain and inflammation. If the bone fragments are out of alignment (displaced), then immediate realigning of the bone (reduction) by a person trained in the procedure is required. Fractures that cannot be reduced manually or are open (the bones protrude through the skin) may require surgery to hold the fracture in place with screws, pins, and plates. Once the ulna is in proper alignment the elbow forearm and wrist must be immobilized for a period of time to allow for healing. After immobilization it is important to perform strengthening and stretching exercises to help regain strength and a full range of motion. These exercises may be completed at home or with a therapist.  MEDICATION   If pain medication is necessary, then nonsteroidal anti-inflammatory medications, such as aspirin and ibuprofen, or other minor pain relievers, such as acetaminophen, are often recommended.  Do not take pain medication for 7 days before surgery.  Prescription pain relievers may be given if deemed necessary by your caregiver. Use only as directed and only as much as you need. COLD THERAPY  Cold treatment (icing) relieves pain and reduces inflammation. Cold treatment should be applied for 10 to 15 minutes every 2 to 3 hours for inflammation and pain and immediately  after any activity that aggravates your symptoms. Use ice packs or massage the area with a piece of ice (ice massage). SEEK MEDICAL CARE IF:   Treatment seems to offer no benefit, or the condition worsens.  Any medications produce adverse side effects.  Any complications from surgery occur:  Pain, numbness, or coldness in the extremity operated upon.  Discoloration of the nail beds (they become blue or gray) of the extremity operated upon.  Signs of infections (fever, pain, inflammation, redness, or persistent bleeding). EXERCISES RANGE OF MOTION (ROM) AND STRETCHING EXERCISES - Ulna Fracture These exercises may help you when beginning to rehabilitate your injury. Your symptoms may resolve with or without further involvement from your physician, physical therapist or athletic trainer. While completing these exercises, remember:   Restoring tissue flexibility helps normal motion to return to the joints. This allows healthier, less painful movement and activity.  An effective stretch should be held for at least 30 seconds.  A stretch should never be painful. You should only feel a gentle lengthening or release in the stretched tissue. RANGE OF MOTION - Wrist Flexion, Active-Assisted  Extend your right / left elbow with your fingers pointing down.*  Gently pull the back of your hand towards you  until you feel a gentle stretch on the top of your forearm.  Hold this position for __________ seconds. Repeat __________ times. Complete this exercise __________ times per day.  *If directed by your physician, physical therapist or athletic trainer, complete this stretch with your elbow bent rather than extended. RANGE OF MOTION - Wrist Extension, Active-Assisted  Extend your right / left elbow and turn your palm upwards.*  Gently pull your palm/fingertips back so your wrist extends and your fingers point more toward the ground.  You should feel a gentle stretch on the inside of your  forearm.  Hold this position for __________ seconds. Repeat __________ times. Complete this exercise __________ times per day. *If directed by your physician, physical therapist or athletic trainer, complete this stretch with your elbow bent, rather than extended. RANGE OF MOTION - Supination, Active   Stand or sit with your elbows at your side. Bend your right / left elbow to 90 degrees.  Turn your palm upward until you feel a gentle stretch on the inside of your forearm.  Hold this position for __________ seconds. Slowly release and return to the starting position. Repeat __________ times. Complete this stretch __________ times per day.  RANGE OF MOTION - Pronation, Active   Stand or sit with your elbows at your side. Bend your right / left elbow to 90 degrees.  Turn your palm downward until you feel a gentle stretch on the top of your forearm.  Hold this position for __________ seconds. Slowly release and return to the starting position. Repeat __________ times. Complete this stretch __________ times per day.  STRETCH - Wrist Flexion  Place the back of your right / left hand on a tabletop leaving your elbow slightly bent. Your fingers should point away from your body.  Gently press the back of your hand down onto the table by straightening your elbow. You should feel a stretch on the top of your forearm.  Hold this position for __________ seconds. Repeat __________ times. Complete this stretch __________ times per day.  STRETCH - Wrist Extension  Place your right / left fingertips on a tabletop leaving your elbow slightly bent. Your fingers should point backwards.  Gently press your fingers and palm down onto the table by straightening your elbow. You should feel a stretch on the inside of your forearm.  Hold this position for __________ seconds. Repeat __________ times. Complete this stretch __________ times per day.  STRENGTHENING EXERCISES - Ulna Fracture These exercises  may help you when beginning to rehabilitate your injury. They may resolve your symptoms with or without further involvement from your physician, physical therapist or athletic trainer. While completing these exercises, remember:   Muscles can gain both the endurance and the strength needed for everyday activities through controlled exercises.  Complete these exercises as instructed by your physician, physical therapist or athletic trainer. Progress the resistance and repetitions only as guided. STRENGTH - Wrist Flexors  Sit with your right / left forearm palm-up and fully supported. Your elbow should be resting below the height of your shoulder. Allow your wrist to extend over the edge of the surface.  Loosely holding a __________ weight or a piece of rubber exercise band/tubing, slowly curl your hand up toward your forearm.  Hold this position for __________ seconds. Slowly lower the wrist back to the starting position in a controlled manner. Repeat __________ times. Complete this exercise __________ times per day.  STRENGTH - Wrist Extensors  Sit with your right / left  forearm palm-down and fully supported. Your elbow should be resting below the height of your shoulder. Allow your wrist to extend over the edge of the surface.  Loosely holding a __________ weight or a piece of rubber exercise band/tubing, slowly curl your hand up toward your forearm.  Hold this position for __________ seconds. Slowly lower the wrist back to the starting position in a controlled manner. Repeat __________ times. Complete this exercise __________ times per day.  STRENGTH - Ulnar Deviators  Stand with a ____________________ weight in your right / left hand, or sit holding on to the rubber exercise band/tubing with your opposite arm supported.  Move your wrist so that your pinkie travels toward your forearm and your thumb moves away from your forearm.  Hold this position for __________ seconds and then slowly  lower the wrist back to the starting position. Repeat __________ times. Complete this exercise __________ times per day STRENGTH - Radial Deviators  Stand with a ____________________ weight in your right / left hand, or sit holding on to the rubber exercise band/tubing with your arm supported.  Raise your hand upward in front of you or pull up on the rubber tubing.  Hold this position for __________ seconds and then slowly lower the wrist back to the starting position. Repeat __________ times. Complete this exercise __________ times per day. STRENGTH - Forearm Supinators  Sit with your right / left forearm supported on a table, keeping your elbow below shoulder height. Rest your hand over the edge, palm down.  Gently grip a hammer or a soup ladle.  Without moving your elbow, slowly turn your palm and hand upward to a "thumbs-up" position.  Hold this position for __________ seconds. Slowly return to the starting position. Repeat __________ times. Complete this exercise __________ times per day.  STRENGTH - Forearm Pronators   Sit with your right / left forearm supported on a table, keeping your elbow below shoulder height. Rest your hand over the edge, palm up.  Gently grip a hammer or a soup ladle.  Without moving your elbow, slowly turn your palm and hand upward to a "thumbs-up" position.  Hold this position for __________ seconds. Slowly return to the starting position. Repeat __________ times. Complete this exercise __________ times per day.  STRENGTH - Grip  Grasp a tennis ball, a dense sponge, or a large, rolled sock in your hand.  Squeeze as hard as you can without increasing any pain.  Hold this position for __________ seconds. Release your grip slowly. Repeat __________ times. Complete this exercise __________ times per day.  Document Released: 12/28/2004 Document Revised: 03/22/2011 Document Reviewed: 04/11/2008 [facility] Patient Information 2015 [place], [place]. This  information is not intended to replace advice given to you by your health care provider. Make sure you discuss any questions you have with your health care provider.

## 2014-03-14 ENCOUNTER — Ambulatory Visit (INDEPENDENT_AMBULATORY_CARE_PROVIDER_SITE_OTHER): Payer: BLUE CROSS/BLUE SHIELD | Admitting: Family Medicine

## 2014-03-14 VITALS — BP 144/90 | HR 65 | Temp 98.3°F | Resp 16 | Ht 70.0 in | Wt 205.0 lb

## 2014-03-14 DIAGNOSIS — S52601K Unspecified fracture of lower end of right ulna, subsequent encounter for closed fracture with nonunion: Secondary | ICD-10-CM

## 2014-03-14 NOTE — Progress Notes (Signed)
Subjective:    Patient ID: Russell Miller, male    DOB: 22-Sep-1970, 44 y.o.   MRN: [id number] This chart was scribed for Russell Sorenson, MD by Russell Miller, Medical Scribe. This patient was seen in Room 9 and the patient's care was started at 3:57 PM.   Chief Complaint  Patient presents with  . Follow-up     HPI HPI Comments: [name] is a 44 y.o. male who presents to the [facility] for a follow-up for right wrist pain. He notes that the redness on his right wrist has decreased, but he still has pain to the area. He notes decreased supination and pronation. Patient did have a right wrist fracture as a child. He denies any weakness.  Past Medical History  Diagnosis Date  . CAD (coronary artery disease) 2011    LCx infarction treated with DES and staged PCI of severe prox LAD stenosis treated with  DES.  . Ischemic cardiomyopathy     mild, with LVEF greater than 45%  . Hyperlipidemia   . Hyperglycemia   . Tobacco abuse    Current Outpatient Prescriptions on File Prior to Visit  Medication Sig Dispense Refill  . aspirin 81 MG tablet Take 81 mg by mouth daily.    . meloxicam (MOBIC) 7.5 MG tablet Take 1 tablet (7.5 mg total) by mouth daily. 30 tablet 0  . metoprolol succinate (TOPROL-XL) 25 MG 24 hr tablet TAKE 1 TABLET BY MOUTH DAILY 30 tablet 6  . nitroGLYCERIN (NITROSTAT) 0.4 MG SL tablet Place 1 tablet (0.4 mg total) under the tongue every 5 (five) minutes as needed. 25 tablet 5   No current facility-administered medications on file prior to visit.   No Known Allergies   Review of Systems  Musculoskeletal: Positive for arthralgias.       Objective:  BP 144/90 mmHg  Pulse 65  Temp(Src) 98.3 F (36.8 C) (Oral)  Resp 16  Ht  (1.778 m)  Wt 205 lb (92.987 kg)  BMI 29.41 kg/m2  SpO2 98%  Physical Exam  Constitutional: He is oriented to person, place, and time. He appears well-developed and well-nourished. No distress.  HENT:  Head:  Normocephalic and atraumatic.  Mouth/Throat: Oropharynx is clear and moist. No oropharyngeal exudate.  Eyes: Pupils are equal, round, and reactive to light.  Neck: Neck supple.  Cardiovascular: Normal rate.   Pulmonary/Chest: Effort normal.  Musculoskeletal: He exhibits no edema.  Neurological: He is alert and oriented to person, place, and time. No cranial nerve deficit.  Skin: Skin is warm and dry. No rash noted.  Psychiatric: He has a normal mood and affect. His behavior is normal.  Nursing note and vitals reviewed.  Results for orders placed or performed in visit on 03/11/14  POCT CBC  Result Value Ref Range   WBC 8.7 4.6 - 10.2 K/uL   Lymph, poc 3.4 0.6 - 3.4   POC LYMPH PERCENT 39.2 10 - 50 %L   MID (cbc) 0.6 0 - 0.9   POC MID % 6.8 0 - 12 %M   POC Granulocyte 4.7 2 - 6.9   Granulocyte percent 54.0 37 - 80 %G   RBC 4.99 4.69 - 6.13 M/uL   Hemoglobin 14.7 14.1 - 18.1 g/dL   HCT, POC 08.6 57.8 - 53.7 %   MCV 91.6 80 - 97 fL   MCH, POC 29.6 27 - 31.2 pg   MCHC 32.3 31.8 - 35.4 g/dL   RDW,  POC 13.4 %   Platelet Count, POC 267 142 - 424 K/uL   MPV 6.5 0 - 99.8 fL  POCT SEDIMENTATION RATE  Result Value Ref Range   POCT SED RATE 5 0 - 22 mm/hr   Dg Wrist Complete Right  03/11/2014   CLINICAL DATA:  Mass on the wrist, insect bite  EXAM: RIGHT WRIST - COMPLETE 3+ VIEW  COMPARISON:  None.  FINDINGS: Four views of the right wrist submitted. No acute fracture or subluxation. There is old fracture of the ulnar styloid with incomplete union. Narrowing of radiocarpal joint space. No radiopaque foreign body.  IMPRESSION: No acute fracture or subluxation. Old fracture of the ulnar styloid with incomplete union. Narrowing of radiocarpal joint space.   Electronically Signed   By: Russell Miller  Pop M.D.   On: 03/11/2014 19:33         Assessment & Plan:  Distal end of ulna fracture, closed, right, with nonunion, subsequent encounter Sxs improving significantly. Stop doxycycline (only on for 2d)  as concern for septic joint or cellulitis unlikely due to labs/imaging.   Prior wrist fracture in childhood that resulted in non-union - this was a new finding for pt seen on xray 2d prev. Suspect inflammatory arthritis vs subclinical re-injury to this.  Increase pain with compression by wrist splint to ok to d/c - cont RICE.  RTC if worsening or call for hand surgery referral.  I personally performed the services described in this documentation, which was scribed in my presence. The recorded information has been reviewed and considered, and addended by me as needed.  Russell SorensonEva Moustafa Mossa, MD MPH

## 2014-03-21 NOTE — Progress Notes (Signed)
Subjective:    Patient ID: Russell Miller, male    DOB: 12/03/1970, 44 y.o.   MRN: [id number] This chart was scribed for Russell Miller. Russell Croft, MD by Chestine Spore, ED Scribe. The patient was seen in room 13 at 6:05 PM.   Chief Complaint  Patient presents with  . Wrist Pain    4 days ? bug bite    Wrist Pain     Russell Miller is a 44 y.o. male who presents today complaining of right wrist pain onset 4 days. Pt woke up and there was initial pain to his wrist and he thought that he had slept wrong. Pt believes that the pain is due to a possible bug bite. Pt denies injury. He states that he is having associated symptoms of muscle cramping, joint swelling. He states that he has not tried any medications for the relief of his symptoms. He denies any other symptoms. Pt is an Personnel officer.     Patient Active Problem List   Diagnosis Date Noted  . OLD MYOCARDIAL INFARCTION 04/18/2009  . SINUSITIS, CHRONIC 04/17/2009  . HYPERLIPIDEMIA 03/18/2009  . TOBACCO ABUSE 03/18/2009  . HYPERGLYCEMIA 03/18/2009  . CAD, NATIVE VESSEL 03/10/2009   Past Medical History  Diagnosis Date  . CAD (coronary artery disease) 2011    LCx infarction treated with DES and staged PCI of severe prox LAD stenosis treated with  DES.  . Ischemic cardiomyopathy     mild, with LVEF greater than 45%  . Hyperlipidemia   . Hyperglycemia   . Tobacco abuse    Past Surgical History  Procedure Laterality Date  . Angioplasty / stenting femoral    . Carotid stent      of the mid left anterior descending with a single drug-eluting stent   No Known Allergies Prior to Admission medications   Medication Sig Start Date End Date Taking? Authorizing Provider  aspirin 81 MG tablet Take 81 mg by mouth daily.    Historical Provider, MD  metoprolol succinate (TOPROL-XL) 25 MG 24 hr tablet TAKE 1 TABLET BY MOUTH DAILY 01/15/14   Micheline Chapman, MD  nitroGLYCERIN (NITROSTAT) 0.4 MG SL tablet Place 1 tablet (0.4 mg total) under the  tongue every 5 (five) minutes as needed. 07/25/13   Beatrice Lecher, PA-C      Review of Systems  Musculoskeletal: Positive for joint swelling and arthralgias.  Skin: Positive for wound.       Redness to the right wrist       Objective:   Physical Exam  Constitutional: He is oriented to person, place, and time. He appears well-developed and well-nourished. No distress.  HENT:  Head: Normocephalic and atraumatic.  Eyes: EOM are normal.  Neck: Neck supple. No tracheal deviation present.  Cardiovascular: Normal rate.   2+ radial and ulnar pulses  Pulmonary/Chest: Effort normal. No respiratory distress.  Musculoskeletal: Normal range of motion.       Right wrist: He exhibits tenderness and swelling.  Significant pain with protonation and supination of the right wrist.   Neurological: He is alert and oriented to person, place, and time.  Skin: Skin is warm and dry. There is erythema.  3 cm diameter poorly circumscribed to right wrist. Firm fluctuant nodule. No significant warmth. Mild erythema. No significant tenderness to pinpoint areas of possible puncture. No edema.   Psychiatric: He has a normal mood and affect. His behavior is normal.  Nursing note and vitals reviewed.   Primary X-ray reading  by, Dr. Clelia CroftShaw: Distal ulnar variance question some type of atypical ossicle versus hx of fracture poorly healed.   Dg Wrist Complete Right  03/11/2014   CLINICAL DATA:  Mass on the wrist, insect bite  EXAM: RIGHT WRIST - COMPLETE 3+ VIEW  COMPARISON:  None.  FINDINGS: Four views of the right wrist submitted. No acute fracture or subluxation. There is old fracture of the ulnar styloid with incomplete union. Narrowing of radiocarpal joint space. No radiopaque foreign body.  IMPRESSION: No acute fracture or subluxation. Old fracture of the ulnar styloid with incomplete union. Narrowing of radiocarpal joint space.   Electronically Signed   By: Natasha MeadLiviu  Pop M.D.   On: 03/11/2014 19:33      BP 140/88  mmHg  Pulse 64  Temp(Src) 97.5 F (36.4 C) (Oral)  Resp 16  Ht 5\' 10"  (1.778 m)  Wt 205 lb 11.2 oz (93.305 kg)  BMI 29.51 kg/m2  SpO2 98%  Assessment & Plan:  DIAGNOSTIC STUDIES: Oxygen Saturation is 98% on room air, normal by my interpretation.    COORDINATION OF CARE: 6:09 PM-Discussed treatment plan which includes right wrist X-ray, with pt at bedside and pt agreed to plan.   Mass of wrist, right - Plan: POCT CBC, DG Wrist Complete Right, POCT SEDIMENTATION RATE, CANCELED: Sedimentation rate  Abscess - Plan: POCT CBC, DG Wrist Complete Right, POCT SEDIMENTATION RATE, CANCELED: Sedimentation rate  Insect bite - Plan: POCT CBC, DG Wrist Complete Right, POCT SEDIMENTATION RATE, CANCELED: Sedimentation rate  Ulnar fracture, right, closed, with nonunion, subsequent encounter - - newly diagnosed today on wrist xray. concern for cellulitis w/ poss spider w/ concern for possibility of septic joint in right wrist.  Immobilized with wrist splint. RICE.    Recheck 48 hrs to ensure improving  Meds ordered this encounter  Medications  . doxycycline (VIBRAMYCIN) 100 MG capsule    Sig: Take 1 capsule (100 mg total) by mouth 2 (two) times daily.    Dispense:  20 capsule    Refill:  0  . meloxicam (MOBIC) 7.5 MG tablet    Sig: Take 1 tablet (7.5 mg total) by mouth daily.    Dispense:  30 tablet    Refill:  0    I personally performed the services described in this documentation, which was scribed in my presence. The recorded information has been reviewed and considered, and addended by me as needed.  Norberto SorensonEva Roderica Cathell, MD MPH

## 2016-10-03 ENCOUNTER — Emergency Department (HOSPITAL_COMMUNITY): Payer: BLUE CROSS/BLUE SHIELD

## 2016-10-03 ENCOUNTER — Emergency Department (HOSPITAL_COMMUNITY)
Admission: EM | Admit: 2016-10-03 | Discharge: 2016-10-03 | Disposition: A | Discharge status: Home / Self Care | Payer: BLUE CROSS/BLUE SHIELD | Attending: Emergency Medicine | Admitting: Emergency Medicine

## 2016-10-03 DIAGNOSIS — Z7982 Long term (current) use of aspirin: Secondary | ICD-10-CM | POA: Insufficient documentation

## 2016-10-03 DIAGNOSIS — Y929 Unspecified place or not applicable: Secondary | ICD-10-CM | POA: Diagnosis not present

## 2016-10-03 DIAGNOSIS — Y998 Other external cause status: Secondary | ICD-10-CM | POA: Diagnosis not present

## 2016-10-03 DIAGNOSIS — I2510 Atherosclerotic heart disease of native coronary artery without angina pectoris: Secondary | ICD-10-CM | POA: Diagnosis not present

## 2016-10-03 DIAGNOSIS — S01412A Laceration without foreign body of left cheek and temporomandibular area, initial encounter: Secondary | ICD-10-CM | POA: Diagnosis not present

## 2016-10-03 DIAGNOSIS — R51 Headache: Secondary | ICD-10-CM | POA: Insufficient documentation

## 2016-10-03 DIAGNOSIS — T07XXXA Unspecified multiple injuries, initial encounter: Secondary | ICD-10-CM

## 2016-10-03 DIAGNOSIS — S0181XA Laceration without foreign body of other part of head, initial encounter: Secondary | ICD-10-CM | POA: Diagnosis not present

## 2016-10-03 DIAGNOSIS — I255 Ischemic cardiomyopathy: Secondary | ICD-10-CM | POA: Diagnosis not present

## 2016-10-03 DIAGNOSIS — Y939 Activity, unspecified: Secondary | ICD-10-CM | POA: Insufficient documentation

## 2016-10-03 DIAGNOSIS — E785 Hyperlipidemia, unspecified: Secondary | ICD-10-CM | POA: Diagnosis not present

## 2016-10-03 DIAGNOSIS — Z87891 Personal history of nicotine dependence: Secondary | ICD-10-CM | POA: Insufficient documentation

## 2016-10-03 DIAGNOSIS — Z23 Encounter for immunization: Secondary | ICD-10-CM | POA: Insufficient documentation

## 2016-10-03 DIAGNOSIS — Z79899 Other long term (current) drug therapy: Secondary | ICD-10-CM | POA: Diagnosis not present

## 2016-10-03 MED ORDER — ONDANSETRON 4 MG PO TBDP
8.0000 mg | ORAL_TABLET | Freq: Once | ORAL | Status: AC
  Administered 2016-10-03: 8 mg via ORAL
  Filled 2016-10-03: qty 2

## 2016-10-03 MED ORDER — TETRACAINE HCL 0.5 % OP SOLN
2.0000 [drp] | Freq: Once | OPHTHALMIC | Status: AC
  Administered 2016-10-03: 2 [drp] via OPHTHALMIC
  Filled 2016-10-03: qty 4

## 2016-10-03 MED ORDER — FLUORESCEIN SODIUM 0.6 MG OP STRP
1.0000 | ORAL_STRIP | Freq: Once | OPHTHALMIC | Status: AC
  Administered 2016-10-03: 1 via OPHTHALMIC
  Filled 2016-10-03: qty 1

## 2016-10-03 MED ORDER — LIDOCAINE-EPINEPHRINE (PF) 2 %-1:200000 IJ SOLN
10.0000 mL | Freq: Once | INTRAMUSCULAR | Status: AC
  Administered 2016-10-03: 10 mL
  Filled 2016-10-03: qty 20

## 2016-10-03 MED ORDER — ONDANSETRON 4 MG PO TBDP: 4.0000 mg | ORAL_TABLET | Freq: Three times a day (TID) | ORAL | 0 refills | Status: DC | PRN

## 2016-10-03 MED ORDER — TETANUS-DIPHTH-ACELL PERTUSSIS 5-2.5-18.5 LF-MCG/0.5 IM SUSP
0.5000 mL | Freq: Once | INTRAMUSCULAR | Status: AC
  Administered 2016-10-03: 0.5 mL via INTRAMUSCULAR
  Filled 2016-10-03: qty 0.5

## 2016-10-03 MED ORDER — HYDROCODONE-ACETAMINOPHEN 5-325 MG PO TABS: 1.0000 | ORAL_TABLET | ORAL | 0 refills | Status: DC | PRN

## 2016-10-03 MED ORDER — IBUPROFEN 600 MG PO TABS: 600.0000 mg | ORAL_TABLET | Freq: Four times a day (QID) | ORAL | 0 refills | Status: DC | PRN
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
## 2016-10-03 NOTE — ED Notes (Signed)
Offered toileting, states  "I'm good for 5 or 6 more minutes. Refusing to use urinal.

## 2016-10-03 NOTE — ED Notes (Signed)
CSI at bedside.

## 2016-10-03 NOTE — Discharge Instructions (Signed)
Return here with any new or concerning symptoms. Sutures can be removed in 4-5 days, with primary care, Urgent Care or through the emergency department.

## 2016-10-16 ENCOUNTER — Ambulatory Visit (INDEPENDENT_AMBULATORY_CARE_PROVIDER_SITE_OTHER): Payer: BLUE CROSS/BLUE SHIELD | Admitting: Physician Assistant

## 2016-10-16 VITALS — BP 135/86 | HR 86 | Temp 97.7°F | Resp 16 | Ht 71.0 in | Wt 188.0 lb

## 2016-10-16 DIAGNOSIS — S0181XA Laceration without foreign body of other part of head, initial encounter: Secondary | ICD-10-CM | POA: Diagnosis not present

## 2016-10-16 DIAGNOSIS — Z4802 Encounter for removal of sutures: Secondary | ICD-10-CM

## 2016-10-16 NOTE — Patient Instructions (Signed)
     IF you received an x-ray today, you will receive an invoice from [facility]. Please contact [facility] at [phone] with questions or concerns regarding your invoice.   IF you received labwork today, you will receive an invoice from [facility]. Please contact [facility] at [phone] with questions or concerns regarding your invoice.   Our billing staff will not be able to assist you with questions regarding bills from these companies.  You will be contacted with the lab results as soon as they are available. The fastest way to get your results is to activate your My Chart account. Instructions are located on the last page of this paperwork. If you have not heard from us regarding the results in 2 weeks, please contact this office.     
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
## 2017-04-11 ENCOUNTER — Encounter: Payer: Self-pay | Admitting: Physician Assistant

## 2019-07-13 ENCOUNTER — Other Ambulatory Visit: Payer: Self-pay

## 2019-07-13 ENCOUNTER — Emergency Department (HOSPITAL_COMMUNITY): Payer: BLUE CROSS/BLUE SHIELD

## 2019-07-13 ENCOUNTER — Emergency Department (HOSPITAL_COMMUNITY)
Admission: EM | Admit: 2019-07-13 | Discharge: 2019-07-13 | Disposition: A | Discharge status: Home / Self Care | Payer: BLUE CROSS/BLUE SHIELD | Attending: Emergency Medicine | Admitting: Emergency Medicine

## 2019-07-13 ENCOUNTER — Encounter (HOSPITAL_COMMUNITY): Payer: Self-pay | Admitting: Emergency Medicine

## 2019-07-13 DIAGNOSIS — I2510 Atherosclerotic heart disease of native coronary artery without angina pectoris: Secondary | ICD-10-CM | POA: Insufficient documentation

## 2019-07-13 DIAGNOSIS — S8990XA Unspecified injury of unspecified lower leg, initial encounter: Secondary | ICD-10-CM

## 2019-07-13 DIAGNOSIS — Z7982 Long term (current) use of aspirin: Secondary | ICD-10-CM | POA: Insufficient documentation

## 2019-07-13 DIAGNOSIS — Y9389 Activity, other specified: Secondary | ICD-10-CM | POA: Insufficient documentation

## 2019-07-13 DIAGNOSIS — S8991XA Unspecified injury of right lower leg, initial encounter: Secondary | ICD-10-CM | POA: Diagnosis present

## 2019-07-13 DIAGNOSIS — Y929 Unspecified place or not applicable: Secondary | ICD-10-CM | POA: Diagnosis not present

## 2019-07-13 DIAGNOSIS — I252 Old myocardial infarction: Secondary | ICD-10-CM | POA: Diagnosis not present

## 2019-07-13 DIAGNOSIS — Z79899 Other long term (current) drug therapy: Secondary | ICD-10-CM | POA: Insufficient documentation

## 2019-07-13 DIAGNOSIS — S82891A Other fracture of right lower leg, initial encounter for closed fracture: Secondary | ICD-10-CM | POA: Diagnosis not present

## 2019-07-13 DIAGNOSIS — Z87891 Personal history of nicotine dependence: Secondary | ICD-10-CM | POA: Diagnosis not present

## 2019-07-13 DIAGNOSIS — S82831A Other fracture of upper and lower end of right fibula, initial encounter for closed fracture: Secondary | ICD-10-CM

## 2019-07-13 DIAGNOSIS — Y999 Unspecified external cause status: Secondary | ICD-10-CM | POA: Diagnosis not present

## 2019-07-13 MED ORDER — HYDROCODONE-ACETAMINOPHEN 5-325 MG PO TABS: 1.0000 | ORAL_TABLET | Freq: Four times a day (QID) | ORAL | 0 refills | Status: DC | PRN

## 2019-07-13 MED ORDER — IBUPROFEN 800 MG PO TABS
800.0000 mg | ORAL_TABLET | Freq: Once | ORAL | Status: AC
  Administered 2019-07-13: 800 mg via ORAL
  Filled 2019-07-13: qty 1

## 2019-07-13 NOTE — Discharge Instructions (Signed)
You have been evaluated for your right leg injury.  I suspect you do have a calf muscle injury as well as a small avulsion fracture involving your right fibula.  Wear knee immobilizer, and use crutches to help move about.  Take pain medication as needed but be aware that it can cause drowsiness so do not operate heavy machinery while on pain medication.  Call and follow-up closely with orthopedist for further management of your condition.

## 2019-07-13 NOTE — ED Notes (Signed)
Pt back from X-ray.  
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
## 2019-07-13 NOTE — ED Notes (Signed)
Ortho tech at bedside 

## 2019-07-13 NOTE — ED Notes (Signed)
Patient verbalizes understanding of discharge instructions. Opportunity for questioning and answers were provided. Armband removed by staff, pt discharged from ED.  

## 2019-07-13 NOTE — ED Notes (Signed)
PA student at bedside.

## 2019-07-13 NOTE — ED Notes (Signed)
Called ortho tech 

## 2019-07-13 NOTE — ED Notes (Signed)
Got patient into a gown on the monitor patient is resting with call bell in reach 

## 2021-08-07 ENCOUNTER — Other Ambulatory Visit: Payer: Self-pay

## 2021-08-07 ENCOUNTER — Inpatient Hospital Stay (HOSPITAL_COMMUNITY)
Admission: EM | Admit: 2021-08-07 | Discharge: 2021-08-14 | DRG: 234 | Disposition: A | Discharge status: Home / Self Care | Payer: Managed Care, Other (non HMO) | Attending: Surgery | Admitting: Surgery

## 2021-08-07 ENCOUNTER — Encounter (HOSPITAL_COMMUNITY): Payer: Self-pay

## 2021-08-07 ENCOUNTER — Emergency Department (HOSPITAL_COMMUNITY): Payer: Managed Care, Other (non HMO)

## 2021-08-07 ENCOUNTER — Inpatient Hospital Stay (HOSPITAL_COMMUNITY): Payer: Managed Care, Other (non HMO)

## 2021-08-07 ENCOUNTER — Inpatient Hospital Stay (HOSPITAL_COMMUNITY)
Admission: EM | Disposition: A | Discharge status: Home / Self Care | Payer: Self-pay | Source: Home / Self Care | Attending: Surgery

## 2021-08-07 DIAGNOSIS — Z91148 Patient's other noncompliance with medication regimen for other reason: Secondary | ICD-10-CM

## 2021-08-07 DIAGNOSIS — Z01810 Encounter for preprocedural cardiovascular examination: Secondary | ICD-10-CM | POA: Diagnosis not present

## 2021-08-07 DIAGNOSIS — F17210 Nicotine dependence, cigarettes, uncomplicated: Secondary | ICD-10-CM | POA: Diagnosis present

## 2021-08-07 DIAGNOSIS — E7800 Pure hypercholesterolemia, unspecified: Secondary | ICD-10-CM | POA: Insufficient documentation

## 2021-08-07 DIAGNOSIS — I214 Non-ST elevation (NSTEMI) myocardial infarction: Principal | ICD-10-CM | POA: Diagnosis present

## 2021-08-07 DIAGNOSIS — I10 Essential (primary) hypertension: Secondary | ICD-10-CM | POA: Diagnosis present

## 2021-08-07 DIAGNOSIS — E8770 Fluid overload, unspecified: Secondary | ICD-10-CM | POA: Diagnosis present

## 2021-08-07 DIAGNOSIS — I255 Ischemic cardiomyopathy: Secondary | ICD-10-CM | POA: Diagnosis present

## 2021-08-07 DIAGNOSIS — I2510 Atherosclerotic heart disease of native coronary artery without angina pectoris: Secondary | ICD-10-CM | POA: Diagnosis present

## 2021-08-07 DIAGNOSIS — R739 Hyperglycemia, unspecified: Secondary | ICD-10-CM | POA: Diagnosis present

## 2021-08-07 DIAGNOSIS — Z951 Presence of aortocoronary bypass graft: Secondary | ICD-10-CM

## 2021-08-07 DIAGNOSIS — R079 Chest pain, unspecified: Secondary | ICD-10-CM

## 2021-08-07 DIAGNOSIS — Z7982 Long term (current) use of aspirin: Secondary | ICD-10-CM | POA: Diagnosis not present

## 2021-08-07 DIAGNOSIS — K5900 Constipation, unspecified: Secondary | ICD-10-CM | POA: Diagnosis not present

## 2021-08-07 DIAGNOSIS — Z833 Family history of diabetes mellitus: Secondary | ICD-10-CM | POA: Diagnosis not present

## 2021-08-07 DIAGNOSIS — Z79899 Other long term (current) drug therapy: Secondary | ICD-10-CM | POA: Diagnosis not present

## 2021-08-07 DIAGNOSIS — F1010 Alcohol abuse, uncomplicated: Secondary | ICD-10-CM | POA: Diagnosis present

## 2021-08-07 DIAGNOSIS — I2583 Coronary atherosclerosis due to lipid rich plaque: Secondary | ICD-10-CM | POA: Diagnosis not present

## 2021-08-07 DIAGNOSIS — D62 Acute posthemorrhagic anemia: Secondary | ICD-10-CM | POA: Diagnosis not present

## 2021-08-07 DIAGNOSIS — E785 Hyperlipidemia, unspecified: Secondary | ICD-10-CM | POA: Diagnosis present

## 2021-08-07 DIAGNOSIS — Z87891 Personal history of nicotine dependence: Secondary | ICD-10-CM | POA: Diagnosis not present

## 2021-08-07 DIAGNOSIS — Z955 Presence of coronary angioplasty implant and graft: Secondary | ICD-10-CM | POA: Diagnosis not present

## 2021-08-07 DIAGNOSIS — Z91128 Patient's intentional underdosing of medication regimen for other reason: Secondary | ICD-10-CM

## 2021-08-07 DIAGNOSIS — Z720 Tobacco use: Secondary | ICD-10-CM | POA: Diagnosis not present

## 2021-08-07 DIAGNOSIS — Z8249 Family history of ischemic heart disease and other diseases of the circulatory system: Secondary | ICD-10-CM

## 2021-08-07 DIAGNOSIS — I252 Old myocardial infarction: Secondary | ICD-10-CM

## 2021-08-07 DIAGNOSIS — I25110 Atherosclerotic heart disease of native coronary artery with unstable angina pectoris: Secondary | ICD-10-CM | POA: Diagnosis present

## 2021-08-07 DIAGNOSIS — J9811 Atelectasis: Secondary | ICD-10-CM | POA: Diagnosis not present

## 2021-08-07 HISTORY — PX: LEFT HEART CATH AND CORONARY ANGIOGRAPHY: CATH118249

## 2021-08-07 LAB — CBC WITH DIFFERENTIAL/PLATELET
Abs Immature Granulocytes: 0.07 10*3/uL (ref 0.00–0.07)
Basophils Absolute: 0.1 10*3/uL (ref 0.0–0.1)
Basophils Relative: 1 %
Eosinophils Absolute: 0.1 10*3/uL (ref 0.0–0.5)
Eosinophils Relative: 1 %
HCT: 48 % (ref 39.0–52.0)
Hemoglobin: 16.7 g/dL (ref 13.0–17.0)
Immature Granulocytes: 1 %
Lymphocytes Relative: 20 %
Lymphs Abs: 2.5 10*3/uL (ref 0.7–4.0)
MCH: 31.9 pg (ref 26.0–34.0)
MCHC: 34.8 g/dL (ref 30.0–36.0)
MCV: 91.6 fL (ref 80.0–100.0)
Monocytes Absolute: 1 10*3/uL (ref 0.1–1.0)
Monocytes Relative: 8 %
Neutro Abs: 8.9 10*3/uL — ABNORMAL HIGH (ref 1.7–7.7)
Neutrophils Relative %: 69 %
Platelets: 264 10*3/uL (ref 150–400)
RBC: 5.24 MIL/uL (ref 4.22–5.81)
RDW: 12.1 % (ref 11.5–15.5)
WBC: 12.6 10*3/uL — ABNORMAL HIGH (ref 4.0–10.5)
nRBC: 0 % (ref 0.0–0.2)

## 2021-08-07 LAB — COMPREHENSIVE METABOLIC PANEL
ALT: 33 U/L (ref 0–44)
AST: 19 U/L (ref 15–41)
Albumin: 3.7 g/dL (ref 3.5–5.0)
Alkaline Phosphatase: 66 U/L (ref 38–126)
Anion gap: 6 (ref 5–15)
BUN: 5 mg/dL — ABNORMAL LOW (ref 6–20)
CO2: 25 mmol/L (ref 22–32)
Calcium: 9 mg/dL (ref 8.9–10.3)
Chloride: 105 mmol/L (ref 98–111)
Creatinine, Ser: 0.86 mg/dL (ref 0.61–1.24)
GFR, Estimated: >60 mL/min (ref >60)
Glucose, Bld: 96 mg/dL (ref 70–99)
Potassium: 4.4 mmol/L (ref 3.5–5.1)
Sodium: 136 mmol/L (ref 135–145)
Total Bilirubin: 1.3 mg/dL — ABNORMAL HIGH (ref 0.3–1.2)
Total Protein: 7.1 g/dL (ref 6.5–8.1)

## 2021-08-07 LAB — LIPID PANEL
Cholesterol: 270 mg/dL — ABNORMAL HIGH (ref 0–200)
HDL: 42 mg/dL (ref >40)
LDL Cholesterol: 199 mg/dL — ABNORMAL HIGH (ref 0–99)
Total CHOL/HDL Ratio: 6.4 RATIO
Triglycerides: 146 mg/dL (ref <150)
VLDL: 29 mg/dL (ref 0–40)

## 2021-08-07 LAB — TROPONIN I (HIGH SENSITIVITY)
Troponin I (High Sensitivity): 1805 ng/L (ref <18)
Troponin I (High Sensitivity): 1922 ng/L (ref <18)

## 2021-08-07 LAB — ECHOCARDIOGRAM COMPLETE
Area-P 1/2: 6.48 cm2
Height: 71 in
S' Lateral: 4.25 cm
Weight: 3200 oz

## 2021-08-07 LAB — LIPASE, BLOOD: Lipase: 38 U/L (ref 11–51)

## 2021-08-07 SURGERY — LEFT HEART CATH AND CORONARY ANGIOGRAPHY
Anesthesia: LOCAL

## 2021-08-07 MED ORDER — MIDAZOLAM HCL 2 MG/2ML IJ SOLN
INTRAMUSCULAR | Status: DC | PRN
  Administered 2021-08-07: 2 mg via INTRAVENOUS

## 2021-08-07 MED ORDER — SODIUM CHLORIDE 0.9 % WEIGHT BASED INFUSION: 3.0000 mL/kg/h | INTRAVENOUS | Status: DC

## 2021-08-07 MED ORDER — METOPROLOL TARTRATE 12.5 MG HALF TABLET
12.5000 mg | ORAL_TABLET | Freq: Two times a day (BID) | ORAL | Status: DC
  Administered 2021-08-07 – 2021-08-09 (×6): 12.5 mg via ORAL
  Filled 2021-08-07 (×6): qty 1

## 2021-08-07 MED ORDER — LORAZEPAM 1 MG PO TABS: 1.0000 mg | ORAL_TABLET | ORAL | Status: DC | PRN

## 2021-08-07 MED ORDER — LIDOCAINE IN D5W 4-5 MG/ML-% IV SOLN
INTRAVENOUS | Status: AC
  Filled 2021-08-07: qty 500

## 2021-08-07 MED ORDER — LABETALOL HCL 5 MG/ML IV SOLN: 10.0000 mg | INTRAVENOUS | Status: AC | PRN

## 2021-08-07 MED ORDER — THIAMINE HCL 100 MG/ML IJ SOLN: 100.0000 mg | Freq: Every day | INTRAMUSCULAR | Status: DC

## 2021-08-07 MED ORDER — HEPARIN SODIUM (PORCINE) 1000 UNIT/ML IJ SOLN
INTRAMUSCULAR | Status: AC
  Filled 2021-08-07: qty 10

## 2021-08-07 MED ORDER — SODIUM CHLORIDE 0.9% FLUSH: 3.0000 mL | Freq: Two times a day (BID) | INTRAVENOUS | Status: DC

## 2021-08-07 MED ORDER — ATORVASTATIN CALCIUM 10 MG PO TABS
20.0000 mg | ORAL_TABLET | Freq: Every day | ORAL | Status: DC
  Administered 2021-08-07 – 2021-08-08 (×2): 20 mg via ORAL
  Filled 2021-08-07 (×2): qty 2

## 2021-08-07 MED ORDER — HEPARIN SODIUM (PORCINE) 1000 UNIT/ML IJ SOLN
INTRAMUSCULAR | Status: DC | PRN
  Administered 2021-08-07: 5000 [IU] via INTRAVENOUS

## 2021-08-07 MED ORDER — ONDANSETRON HCL 4 MG/2ML IJ SOLN: 4.0000 mg | Freq: Four times a day (QID) | INTRAMUSCULAR | Status: DC | PRN

## 2021-08-07 MED ORDER — FAMOTIDINE 20 MG PO TABS
20.0000 mg | ORAL_TABLET | Freq: Once | ORAL | Status: AC
  Administered 2021-08-07: 20 mg via ORAL
  Filled 2021-08-07: qty 1

## 2021-08-07 MED ORDER — IOHEXOL 350 MG/ML SOLN
INTRAVENOUS | Status: DC | PRN
  Administered 2021-08-07: 60 mL

## 2021-08-07 MED ORDER — LORAZEPAM 1 MG PO TABS: 0.0000 mg | ORAL_TABLET | Freq: Two times a day (BID) | ORAL | Status: DC

## 2021-08-07 MED ORDER — ACETAMINOPHEN 325 MG PO TABS: 650.0000 mg | ORAL_TABLET | ORAL | Status: DC | PRN

## 2021-08-07 MED ORDER — SODIUM CHLORIDE 0.9% FLUSH
3.0000 mL | Freq: Two times a day (BID) | INTRAVENOUS | Status: DC
  Administered 2021-08-07 (×2): 3 mL via INTRAVENOUS

## 2021-08-07 MED ORDER — MIDAZOLAM HCL 2 MG/2ML IJ SOLN
INTRAMUSCULAR | Status: AC
  Filled 2021-08-07: qty 2

## 2021-08-07 MED ORDER — LIDOCAINE VISCOUS HCL 2 % MT SOLN
15.0000 mL | Freq: Once | OROMUCOSAL | Status: AC
  Administered 2021-08-07: 15 mL via ORAL
  Filled 2021-08-07: qty 15

## 2021-08-07 MED ORDER — ADULT MULTIVITAMIN W/MINERALS CH
1.0000 | ORAL_TABLET | Freq: Every day | ORAL | Status: DC
  Administered 2021-08-07 – 2021-08-14 (×7): 1 via ORAL
  Filled 2021-08-07 (×7): qty 1

## 2021-08-07 MED ORDER — SODIUM CHLORIDE 0.9 % IV SOLN
250.0000 mL | INTRAVENOUS | Status: DC | PRN
Start: 2021-08-07 — End: 2021-08-10

## 2021-08-07 MED ORDER — HYDRALAZINE HCL 20 MG/ML IJ SOLN: 10.0000 mg | INTRAMUSCULAR | Status: AC | PRN

## 2021-08-07 MED ORDER — LIDOCAINE HCL (PF) 1 % IJ SOLN
INTRAMUSCULAR | Status: AC
  Filled 2021-08-07: qty 30

## 2021-08-07 MED ORDER — ALUM & MAG HYDROXIDE-SIMETH 200-200-20 MG/5ML PO SUSP
30.0000 mL | Freq: Once | ORAL | Status: AC
  Administered 2021-08-07: 30 mL via ORAL
  Filled 2021-08-07: qty 30

## 2021-08-07 MED ORDER — VERAPAMIL HCL 2.5 MG/ML IV SOLN
INTRAVENOUS | Status: AC
  Filled 2021-08-07: qty 2

## 2021-08-07 MED ORDER — ASPIRIN 81 MG PO TBEC: 81.0000 mg | DELAYED_RELEASE_TABLET | Freq: Every day | ORAL | Status: DC

## 2021-08-07 MED ORDER — HEPARIN BOLUS VIA INFUSION
4000.0000 [IU] | Freq: Once | INTRAVENOUS | Status: AC
  Administered 2021-08-07: 4000 [IU] via INTRAVENOUS
  Filled 2021-08-07: qty 4000

## 2021-08-07 MED ORDER — LORAZEPAM 2 MG/ML IJ SOLN: 1.0000 mg | INTRAMUSCULAR | Status: DC | PRN

## 2021-08-07 MED ORDER — LORAZEPAM 1 MG PO TABS: 0.0000 mg | ORAL_TABLET | Freq: Four times a day (QID) | ORAL | Status: DC

## 2021-08-07 MED ORDER — HEPARIN (PORCINE) 25000 UT/250ML-% IV SOLN
1150.0000 [IU]/h | INTRAVENOUS | Status: DC
Start: 2021-08-07 — End: 2021-08-07
  Administered 2021-08-07: 1150 [IU]/h via INTRAVENOUS
  Filled 2021-08-07: qty 250

## 2021-08-07 MED ORDER — SODIUM CHLORIDE 0.9 % IV SOLN: INTRAVENOUS | Status: AC

## 2021-08-07 MED ORDER — LIDOCAINE HCL (PF) 1 % IJ SOLN
INTRAMUSCULAR | Status: DC | PRN
  Administered 2021-08-07: 2 mL

## 2021-08-07 MED ORDER — HEPARIN (PORCINE) IN NACL 1000-0.9 UT/500ML-% IV SOLN
INTRAVENOUS | Status: DC | PRN
  Administered 2021-08-07 (×2): 500 mL

## 2021-08-07 MED ORDER — NITROGLYCERIN 0.4 MG SL SUBL
0.4000 mg | SUBLINGUAL_TABLET | SUBLINGUAL | Status: DC | PRN
  Administered 2021-08-07: 0.4 mg via SUBLINGUAL
  Filled 2021-08-07 (×2): qty 1

## 2021-08-07 MED ORDER — THIAMINE HCL 100 MG PO TABS
100.0000 mg | ORAL_TABLET | Freq: Every day | ORAL | Status: DC
  Administered 2021-08-07 – 2021-08-14 (×7): 100 mg via ORAL
  Filled 2021-08-07 (×7): qty 1

## 2021-08-07 MED ORDER — SODIUM CHLORIDE 0.9% FLUSH
3.0000 mL | INTRAVENOUS | Status: DC | PRN
Start: 2021-08-07 — End: 2021-08-10

## 2021-08-07 MED ORDER — HEPARIN (PORCINE) 25000 UT/250ML-% IV SOLN
1550.0000 [IU]/h | INTRAVENOUS | Status: DC
  Administered 2021-08-07: 1150 [IU]/h via INTRAVENOUS
  Administered 2021-08-08: 1400 [IU]/h via INTRAVENOUS
  Administered 2021-08-09 (×2): 1550 [IU]/h via INTRAVENOUS
  Filled 2021-08-07 (×3): qty 250

## 2021-08-07 MED ORDER — FOLIC ACID 1 MG PO TABS
1.0000 mg | ORAL_TABLET | Freq: Every day | ORAL | Status: DC
  Administered 2021-08-07 – 2021-08-14 (×7): 1 mg via ORAL
  Filled 2021-08-07 (×7): qty 1

## 2021-08-07 MED ORDER — VERAPAMIL HCL 2.5 MG/ML IV SOLN
INTRAVENOUS | Status: DC | PRN
  Administered 2021-08-07: 10 mL via INTRA_ARTERIAL

## 2021-08-07 MED ORDER — SODIUM CHLORIDE 0.9 % WEIGHT BASED INFUSION: 1.0000 mL/kg/h | INTRAVENOUS | Status: DC

## 2021-08-07 MED ORDER — ASPIRIN 325 MG PO TABS
325.0000 mg | ORAL_TABLET | Freq: Every day | ORAL | Status: DC
  Administered 2021-08-07 – 2021-08-09 (×3): 325 mg via ORAL
  Filled 2021-08-07 (×3): qty 1

## 2021-08-07 MED ORDER — HEPARIN (PORCINE) IN NACL 1000-0.9 UT/500ML-% IV SOLN
INTRAVENOUS | Status: AC
  Filled 2021-08-07: qty 1000

## 2021-08-07 SURGICAL SUPPLY — 11 items
BAND CMPR LRG ZPHR (HEMOSTASIS) ×1
BAND ZEPHYR COMPRESS 30 LONG (HEMOSTASIS) ×1 IMPLANT
CATH 5FR JL3.5 JR4 ANG PIG MP (CATHETERS) ×1 IMPLANT
GLIDESHEATH SLEND SS 6F .021 (SHEATH) ×1 IMPLANT
GUIDEWIRE INQWIRE 1.5J.035X260 (WIRE) IMPLANT
INQWIRE 1.5J .035X260CM (WIRE) ×2
KIT HEART LEFT (KITS) ×3 IMPLANT
PACK CARDIAC CATHETERIZATION (CUSTOM PROCEDURE TRAY) ×3 IMPLANT
SYR MEDRAD MARK 7 150ML (SYRINGE) ×3 IMPLANT
TRANSDUCER W/STOPCOCK (MISCELLANEOUS) ×3 IMPLANT
TUBING CIL FLEX 10 FLL-RA (TUBING) ×3 IMPLANT

## 2021-08-07 NOTE — ED Triage Notes (Signed)
Pt came in via POV d/t CP that began Tuesday (08/04/21) at 1400. Rates his central CP 8/10, does radiate to his back, bilateral arm tingling, exertion makes the pain worse. Took an old Nitro he had with no relief, used to take baby aspirin daily but have not for the last month. Does have a cardiac Hx of stents, A/Ox4. Denies any n/v, lightheaded or dizziness.

## 2021-08-07 NOTE — ED Notes (Signed)
Pt transported to cath lab.  

## 2021-08-07 NOTE — Progress Notes (Signed)
Echocardiogram 2D Echocardiogram has been performed.  Lucendia Herrlich 08/07/2021, 3:44 PM

## 2021-08-07 NOTE — Progress Notes (Signed)
ANTICOAGULATION CONSULT NOTE  Pharmacy Consult for heparin Indication: chest pain/ACS  Heparin Dosing Weight: 90.7 kg  Labs: Recent Labs    08/07/21 0857  HGB 16.7  HCT 48.0  PLT 264  CREATININE 0.86     Assessment: 50 yom with PMH CAD, HTN, HLD presenting with CP, elevated troponin. Pharmacy consulted to dose heparin for NSTEMI. Patient is not on anticoagulation PTA.   Underwent cardiac cath finding severe three-vessel CAD- plan for CTVS evaluation. Okay to restart heparin 2 hours after TR band removed (documented removed on 7/28 at 1640). No s/sx of bleeding outside of at TR while trying to remove. CBC WNL.   Goal of Therapy:  Heparin level 0.3-0.7 units/ml Monitor platelets by anticoagulation protocol: Yes   Plan:  Restart heparin infusion at 1150 units/hr on 7/28 at 1900 6h heparin level Monitor daily heparin level and CBC, s/sx bleeding F/u CTVS plans  Sherron Monday, PharmD, BCCCP Clinical Pharmacist  Phone: [phone] 08/07/2021 4:12 PM  Please check AMION for all [facility] Pharmacy phone numbers After 10:00 PM, call Main Pharmacy [phone]

## 2021-08-07 NOTE — ED Notes (Signed)
Patient transported to X-ray 

## 2021-08-07 NOTE — Progress Notes (Signed)
ANTICOAGULATION CONSULT NOTE  Pharmacy Consult for heparin Indication: chest pain/ACS  Heparin Dosing Weight: 90.7 kg  Labs: Recent Labs    08/07/21 0857  HGB 16.7  HCT 48.0  PLT 264  CREATININE 0.86    Assessment: 50 yom with PMH CAD, HTN, HLD presenting with CP, elevated troponin. Pharmacy consulted to dose heparin for NSTEMI. Patient is not on anticoagulation PTA. CBC wnl. No bleed issues reported. Estimated weight in ER ~200 lbs per patient.  Goal of Therapy:  Heparin level 0.3-0.7 units/ml Monitor platelets by anticoagulation protocol: Yes   Plan:  Heparin 4000 unit bolus Start heparin at 1150 units/h 6h heparin level Monitor daily heparin level and CBC, s/sx bleeding F/u Cardiology plans   Leia Alf, PharmD, BCPS Clinical Pharmacist 08/07/2021 10:44 AM

## 2021-08-07 NOTE — Consult Note (Addendum)
[address]       [place] [address]             [phone]        [facility] P [facility] Medical Record [id number] Date of Birth: July 29, 1970  Referring: Angelena Form Primary Care: Patient, No Pcp Per Primary Cardiologist:None  Chief Complaint:    Chief Complaint  Patient presents with   Chest Pain    History of Present Illness:      Russell Miller is a 51 yo obese male with history of nicotine use, alcohol abuse, HLD, and known CAD.  He had a previous stent placed to his LAD back in 2014.  The patient has not followed up since that time and is not taking medications prior to admission.  He presented to the ED this morning with complaints of CP that began several days ago.  He stated the pain radiated into his back.  He was also experiencing tingling in both of his arms.  The patient was exacerbated with activity, even just walking across the room.  He denied nausea, vomiting, near syncopal symptoms.  He took old NTG that was expired without relief.  Workup in the ED included EKG which showed NSR with no ST changes.  Initial troponin level was 1800.  CXR obtained was normal.  He was started on IV heparin and Cardiology consult was obtained.  On evaluation he was chest pain free.  He was ruled in for NSTEMI and admitted to the hospital for further care.  He underwent cardiac catheterization this afternoon which showed  severe 3 vessel CAD with in stent re-stenosis.  It was felt his best long term outcome would be coronary bypass grafting surgery and Cardiothoracic surgery consultation was requested.  The patient is currently chest pain free.  He continues to smoke 2 ppd per day.  He is heavy a drinker of at least 12-24 beers per day.  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  He is lost to follow up since 2014 and is non-compliant having stopped his medications in 2015.  He has postive family history of CAD.  He does not want to have surgery.  He states that  is why he delayed coming to the hospital.  Current Activity/ Functional Status: Patient is independent with mobility/ambulation, transfers, ADL's, IADL's.   Zubrod Score: At the time of surgery this patient's most appropriate activity status/level should be described as: []     0    Normal activity, no symptoms [x]     1    Restricted in physical strenuous activity but ambulatory, able to do out light work []     2    Ambulatory and capable of self care, unable to do work activities, up and about                 more than 50%  Of the time                            []     3    Only limited self care, in bed greater than 50% of waking hours []     4    Completely disabled, no self care, confined to bed or chair []     5    Moribund  Past Medical History:  Diagnosis Date   CAD (coronary artery disease) 2011   LCx infarction treated with DES and staged PCI  of severe prox LAD stenosis treated with  DES.   Hyperglycemia    Hyperlipidemia    Ischemic cardiomyopathy    mild, with LVEF greater than 45%   Tobacco abuse     Past Surgical History:  Procedure Laterality Date   ANGIOPLASTY / STENTING FEMORAL     CAROTID STENT     of the mid left anterior descending with a single drug-eluting stent    Social History   Tobacco Use  Smoking Status Former   Types: Cigarettes   Quit date: 01/11/2009   Years since quitting: 12.5  Smokeless Tobacco Never    Social History   Substance and Sexual Activity  Alcohol Use No     No Known Allergies  Current Facility-Administered Medications  Medication Dose Route Frequency Provider Last Rate Last Admin   0.9 %  sodium chloride infusion   Intravenous Continuous Kathleene Hazel, MD       0.9 %  sodium chloride infusion  250 mL Intravenous PRN Kathleene Hazel, MD       acetaminophen (TYLENOL) tablet 650 mg  650 mg Oral Q4H PRN Jonita Albee, PA-C       [START ON 08/08/2021] aspirin EC tablet 81 mg  81 mg Oral Daily Jonita Albee, PA-C       aspirin tablet 325 mg  325 mg Oral Daily Terald Sleeper, MD   325 mg at 08/07/21 1018   atorvastatin (LIPITOR) tablet 20 mg  20 mg Oral Daily Robet Leu R, PA-C       hydrALAZINE (APRESOLINE) injection 10 mg  10 mg Intravenous Q20 Min PRN Kathleene Hazel, MD       labetalol (NORMODYNE) injection 10 mg  10 mg Intravenous Q10 min PRN Kathleene Hazel, MD       metoprolol tartrate (LOPRESSOR) tablet 12.5 mg  12.5 mg Oral BID Jonita Albee, PA-C       nitroGLYCERIN (NITROSTAT) SL tablet 0.4 mg  0.4 mg Sublingual Q5 min PRN Terald Sleeper, MD   0.4 mg at 08/07/21 1019   ondansetron (ZOFRAN) injection 4 mg  4 mg Intravenous Q6H PRN Robet Leu R, PA-C       sodium chloride flush (NS) 0.9 % injection 3 mL  3 mL Intravenous Q12H Robet Leu R, PA-C   3 mL at 08/07/21 1205   sodium chloride flush (NS) 0.9 % injection 3 mL  3 mL Intravenous Q12H Kathleene Hazel, MD       sodium chloride flush (NS) 0.9 % injection 3 mL  3 mL Intravenous PRN Kathleene Hazel, MD        Medications Prior to Admission  Medication Sig Dispense Refill Last Dose   aspirin 81 MG tablet Take 81 mg by mouth daily.   08/07/2021   nitroGLYCERIN (NITROSTAT) 0.4 MG SL tablet Place 1 tablet (0.4 mg total) under the tongue every 5 (five) minutes as needed. 25 tablet 5 08/07/2021    Family History  Problem Relation Age of Onset   Heart attack Mother    Hyperlipidemia Mother    Hypertension Mother    Heart attack Maternal Grandmother    Diabetes Maternal Grandmother    Stroke Maternal Grandmother    Cancer Cousin      Review of Systems:        Cardiac Review of Systems: Y or  [    ]= no  Chest Pain [  Y  ]  Resting SOB [   ]  Exertional SOB  [Y  ]  Orthopnea [  ]   Pedal Edema [ N  ]    Palpitations [  ] Syncope  [  ]   Presyncope [   ]  General Review of Systems: [Y] = yes [  ]=no Constitional: recent weight change Klaus.Mock  ]; anorexia [  ];  fatigue [  ]; nausea [ N ]; night sweats [  ]; fever [  ]; or chills [  ]                                                               Dental: Last Dentist visit: few weeks ago  Eye : blurred vision [  ]; diplopia [   ]; vision changes [  ];  Amaurosis fugax[  ]; Resp: cough [  ];  wheezing[  ];  hemoptysis[  ]; shortness of breath[ N ]; paroxysmal nocturnal dyspnea[  ]; dyspnea on exertion[  ]; or orthopnea[  ];  GI:  gallstones[  ], vomiting[ N ];  dysphagia[  ]; melena[  ];  hematochezia [  ]; heartburn[  ];   Hx of  Colonoscopy[  ]; GU: kidney stones [  ]; hematuria[  ];   dysuria [  ];  nocturia[  ];  history of     obstruction [  ]; urinary frequency [  ]             Skin: rash, swelling[  N];, hair loss[  ];  peripheral edema[  ];  or itching[  ]; Musculosketetal: myalgias[  ];  joint swelling[  ];  joint erythema[  ];  joint pain[  ];  back pain[  ];  Heme/Lymph: bruising[  ];  bleeding[  ];  anemia[  ];  Neuro: TIA[  ];  headaches[  ];  stroke[ N ];  vertigo[  ];  seizures[  ];   paresthesias[  ];  difficulty walking[N  ];  Psych:depression[  ]; anxiety[  ];  Endocrine: diabetes[ N ];  thyroid dysfunction[  ];    Physical Exam: BP (!) 151/96   Pulse 92   Temp 98.3 F (36.8 C) (Oral)   Resp 13   Ht 5\' 11"  (1.803 m)   Wt 90.7 kg   SpO2 98%   BMI 27.89 kg/m    General appearance: alert, cooperative, and no distress Neck: no adenopathy, no carotid bruit, no JVD, supple, symmetrical, trachea midline, and thyroid not enlarged, symmetric, no tenderness/mass/nodules Resp: clear to auscultation bilaterally Cardio: regular rate and rhythm, S1, S2 normal, no murmur, click, rub or gallop GI: soft, non-tender; bowel sounds normal; no masses,  no organomegaly Extremities: extremities normal, atraumatic, no cyanosis or edema, right handed Neurologic: Grossly normal  Diagnostic Studies & Laboratory data:     Recent Radiology Findings:   CARDIAC CATHETERIZATION  Result Date:  08/07/2021   Prox RCA to Mid RCA lesion is 100% stenosed.   Mid Cx to Dist Cx lesion is 99% stenosed.   2nd Mrg lesion is 99% stenosed.   Ost Cx to Prox Cx lesion is 70% stenosed.   1st Diag lesion is 90% stenosed.   Mid LAD-1 lesion is 90% stenosed.   Mid LAD-2 lesion is 50% stenosed.   There is mild left  ventricular systolic dysfunction.   LV end diastolic pressure is normal.   The left ventricular ejection fraction is 45-50% by visual estimate. Severe triple vessel CAD The LAD is a large caliber vessel that courses to the apex. The mid LAD stented segment has severe restenosis. There is moderate stenosis just beyond the stented segment. The large caliber diagonal branch has severe ostial stenosis. The Circumflex is a large caliber vessel with moderately severe ostial stenosis. The mid stented segment involving the obtuse marginal branch has severe stent restenosis with TIMI-2 flow beyond the stent. The large, dominant RCA is completely occluded in the mid segment. The distal vessel and branches fill from left to right collaterals. Mild LV systolic dysfunction with inferior wall hypokinesis. Normal LVEDP Recommendations: Given severe triple vessel CAD including severe stent restenosis, medical non-compliance and ongoing tobacco abuse, I think bypass surgery is his best option for complete revascularization and long term patency. Will continue ASA, statin, Resume IV heparin drip 2 hours post sheath pull. Echo pending. Will consult CT surgery to discuss CABG.   DG Chest 2 View  Result Date: 08/07/2021 CLINICAL DATA:  Chest pain EXAM: CHEST - 2 VIEW COMPARISON:  Chest radiograph 03/09/2009 FINDINGS: The cardiomediastinal silhouette is normal. There is no focal consolidation or pulmonary edema. There is no pleural effusion or pneumothorax There is no acute osseous abnormality. IMPRESSION: No radiographic evidence of acute cardiopulmonary process. Electronically Signed   By: Valetta Mole M.D.   On: 08/07/2021 08:52      I have independently reviewed the above radiologic studies and discussed with the patient   Recent Lab Findings: Lab Results  Component Value Date   WBC 12.6 (H) 08/07/2021   HGB 16.7 08/07/2021   HCT 48.0 08/07/2021   PLT 264 08/07/2021   GLUCOSE 96 08/07/2021   CHOL 270 (H) 08/07/2021   TRIG 146 08/07/2021   HDL 42 08/07/2021   LDLDIRECT 196.5 03/24/2010   LDLCALC 199 (H) 08/07/2021   ALT 33 08/07/2021   AST 19 08/07/2021   NA 136 08/07/2021   K 4.4 08/07/2021   CL 105 08/07/2021   CREATININE 0.86 08/07/2021   BUN 5 (L) 08/07/2021   CO2 25 08/07/2021   TSH 0.468 Test methodology is 3rd generation TSH 03/10/2009   INR 0.91 03/09/2009   HGBA1C 5.9 04/18/2009      Assessment / Plan:      NSTEMI/CAD- severe 3 vessel disease with in stent re-stensosi.. requesting CABG consult, however patient adamantly does not want surgery.  I explained that this is his best long term treatment option.  He states he wants stents.  HLD Nicotine Abuse- 2 ppd smoker, counseled patient on importance of quitting Alcohol Abuse Medical Non-compliance- stressed importance of taking all medications prescribed to ensure that should he agree to surgery he gets the most out of his bypass grafting. Dispo- patient admitted with NSTEMI/CAD, requesting CABG consult.  Patient is currently chest pain free.  He does not want surgery, however this is his best option.  Dr. Roxan Hockey will evaluate, however if patient refuses, cardiology can evaluate further for possible PCI.    I  spent 55 minutes counseling the patient face to face.  Ellwood Handler, PA-C 08/07/2021 2:03 PM  Patient seen and examined, records reviewed.  I agree with the history and physical as detailed above.    I personally reviewed the cath images.  He has severe 3 vessel CAD.  CABG is best option for complete revascularization and survival benefit and  relief of symptoms.  I discussed with the patient the general nature of the  procedure, including the need for general anesthesia, the use of cardiopulmonary bypass, the incisions to be used, and the use of drainage tubes and temporary pacing wires.  We discussed the expected hospital stay, overall recovery and short and long term outcomes.  I informed him of the indications, risks, benefits and alternatives.  He understands the risks include, but are not limited to death, stroke, MI, DVT/PE, bleeding, possible need for transfusion, infections, cardiac arrhthymias, as well as other organ system dysfunction including respiratory, renal, or GI complications.  He is reluctant to have surgery and wants to discuss possible percutaneous options with Cardiology.  Revonda Standard Roxan Hockey, MD [facility] [phone]

## 2021-08-07 NOTE — Interval H&P Note (Signed)
History and Physical Interval Note:  08/07/2021 12:38 PM  Russell Miller  has presented today for surgery, with the diagnosis of nstemi.  The various methods of treatment have been discussed with the patient and family. After consideration of risks, benefits and other options for treatment, the patient has consented to  Procedure(s): LEFT HEART CATH AND CORONARY ANGIOGRAPHY (N/A) as a surgical intervention.  The patient's history has been reviewed, patient examined, no change in status, stable for surgery.  I have reviewed the patient's chart and labs.  Questions were answered to the patient's satisfaction.    Cath Lab Visit (complete for each Cath Lab visit)  Clinical Evaluation Leading to the Procedure:   ACS: Yes.    Non-ACS:    Anginal Classification: CCS III  Anti-ischemic medical therapy: No Therapy  Non-Invasive Test Results: No non-invasive testing performed  Prior CABG: No previous CABG        Verne Carrow

## 2021-08-07 NOTE — ED Provider Notes (Signed)
[facility] EMERGENCY DEPARTMENT Provider Note   CSN: [id number] Arrival date & time: 08/07/21  4098     History {Add pertinent medical, surgical, social history, OB history to HPI:1} Chief Complaint  Patient presents with   Chest Pain    Russell Miller is a 51 y.o. male with history of coronary disease status post 2 stents in 2011 (Lcx, LAD), smoking, presented to ED with complaint of chest pain.  The patient reports that he began having symptoms Tuesday while at rest, with epigastric discomfort that radiates in a bandlike pattern across his lower chest.  It is also associated with tingling in his fingers.  The symptoms have been coming and going the past 2 days but were intense last night, prompting his visit to the ER today.  He reports that the pain has been notable whenever he has any type of movement or exertion, including walking even a few steps around the house.  When he rests it seems to get better. Last night he took 1 nitroglycerin tablet last night with no relief, but reports his tablets for several years expired.  He does not normally take any medications at all.    He reports that his pain is reminiscent of when he had an MI in 2014.  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   He also reports that he does suffer from "awake and heartburn".  He says this is particularly bad when he drinks [facility], and it will often feel like he is having a heart attack.  He does not take any medications for his heartburn.  He is supposed to be on aspirin and other medications for his ischemic heart disease but says he has not seen a cardiologist in several years and is not taking his medications.    HPI     Home Medications Prior to Admission medications   Medication Sig Start Date End Date Taking? Authorizing  Provider  aspirin 81 MG tablet Take 81 mg by mouth daily.    [provider]  HYDROcodone-acetaminophen (NORCO/VICODIN) 5-325 MG tablet Take 1 tablet by mouth every 6 (six) hours as needed for moderate pain or severe pain. 07/13/19   Fayrene Helper, PA-C  ibuprofen (ADVIL,MOTRIN) 600 MG tablet Take 1 tablet (600 mg total) by mouth every 6 (six) hours as needed. Patient not taking: Reported on 10/16/2016 10/03/16   Elpidio Anis, PA-C  meloxicam (MOBIC) 7.5 MG tablet Take 1 tablet (7.5 mg total) by mouth daily. Patient not taking: Reported on 10/16/2016 03/11/14   Sherren Mocha, MD  metoprolol succinate (TOPROL-XL) 25 MG 24 hr tablet TAKE 1 TABLET BY MOUTH DAILY Patient not taking: Reported on 10/16/2016 01/15/14   Tonny Bollman, MD  nitroGLYCERIN (NITROSTAT) 0.4 MG SL tablet Place 1 tablet (0.4 mg total) under the tongue every 5 (five) minutes as needed. Patient not taking: Reported on 10/16/2016 07/25/13   Tereso Newcomer T, PA-C  ondansetron (ZOFRAN ODT) 4 MG disintegrating tablet Take 1 tablet (4 mg total) by mouth every 8 (eight) hours as needed for nausea or vomiting. Patient not taking: Reported on 10/16/2016 10/03/16   Elpidio Anis, PA-C      Allergies    Patient has no known allergies.    Review of Systems   Review of Systems  Physical Exam Updated Vital Signs  BP 123/83   Pulse 86   Temp 98.7 F (37.1 C) (Oral)   Resp 19   SpO2 97%  Physical Exam Constitutional:      General: He is not in acute distress. HENT:     Head: Normocephalic and atraumatic.  Eyes:     Conjunctiva/sclera: Conjunctivae normal.     Pupils: Pupils are equal, round, and reactive to light.  Cardiovascular:     Rate and Rhythm: Normal rate and regular rhythm.  Pulmonary:     Effort: Pulmonary effort is normal. No respiratory distress.  Abdominal:     General: There is no distension.     Tenderness: There is abdominal tenderness in the epigastric area.  Skin:    General: Skin is warm and dry.   Neurological:     General: No focal deficit present.     Mental Status: He is alert. Mental status is at baseline.  Psychiatric:        Mood and Affect: Mood normal.        Behavior: Behavior normal.     ED Results / Procedures / Treatments   Labs (all labs ordered are listed, but only abnormal results are displayed) Labs Reviewed  COMPREHENSIVE METABOLIC PANEL - Abnormal; Notable for the following components:      Result Value   BUN 5 (*)    Total Bilirubin 1.3 (*)    All other components within normal limits  CBC WITH DIFFERENTIAL/PLATELET - Abnormal; Notable for the following components:   WBC 12.6 (*)    Neutro Abs 8.9 (*)    All other components within normal limits  TROPONIN I (HIGH SENSITIVITY) - Abnormal; Notable for the following components:   Troponin I (High Sensitivity) 1,805 (*)    All other components within normal limits  LIPASE, BLOOD  LIPID PANEL  TROPONIN I (HIGH SENSITIVITY)    EKG EKG Interpretation  Date/Time:  Friday August 07 2021 10:37:52 EDT Ventricular Rate:  79 PR Interval:  105 QRS Duration: 116 QT Interval:  406 QTC Calculation: 466 R Axis:   34 Text Interpretation: Sinus rhythm Short PR interval Probable left atrial enlargement Nonspecific intraventricular conduction delay Confirmed by Alvester Chou [id number]) on 08/07/2021 10:39:31 AM  Radiology DG Chest 2 View  Result Date: 08/07/2021 CLINICAL DATA:  Chest pain EXAM: CHEST - 2 VIEW COMPARISON:  Chest radiograph 03/09/2009 FINDINGS: The cardiomediastinal silhouette is normal. There is no focal consolidation or pulmonary edema. There is no pleural effusion or pneumothorax There is no acute osseous abnormality. IMPRESSION: No radiographic evidence of acute cardiopulmonary process. Electronically Signed   By: Lesia Hausen M.D.   On: 08/07/2021 08:52    Procedures .Critical Care  Performed by: Terald Sleeper, MD Authorized by: Terald Sleeper, MD   Critical care provider statement:     Critical care time (minutes):  45   Critical care time was exclusive of:  Separately billable procedures and treating other patients   Critical care was necessary to treat or prevent imminent or life-threatening deterioration of the following conditions:  Cardiac failure   Critical care was time spent personally by me on the following activities:  Ordering and performing treatments and interventions, ordering and review of laboratory studies, ordering and review of radiographic studies, pulse oximetry, review of old charts, examination of patient and evaluation of patient's response to treatment   Care discussed with: admitting provider   Comments:     NSTEMI requiring heparin, aspirin, repeat ECG, for suspected ACS   {  Document cardiac monitor, telemetry assessment procedure when appropriate:1}  Medications Ordered in ED Medications  nitroGLYCERIN (NITROSTAT) SL tablet 0.4 mg (0.4 mg Sublingual Given 08/07/21 1019)  aspirin tablet 325 mg (325 mg Oral Given 08/07/21 1018)  alum & mag hydroxide-simeth (MAALOX/MYLANTA) 200-200-20 MG/5ML suspension 30 mL (30 mLs Oral Given 08/07/21 0857)    And  lidocaine (XYLOCAINE) 2 % viscous mouth solution 15 mL (15 mLs Oral Given 08/07/21 0859)  famotidine (PEPCID) tablet 20 mg (20 mg Oral Given 08/07/21 0857)    ED Course/ Medical Decision Making/ A&P Clinical Course as of 08/07/21 1042  Fri Aug 07, 2021  1003 No improvement of his symptoms after GI medications.  We will try nitroglycerin. [MT]  [id number] Cardiology paged for NSTEMI.  Heparin and aspirin ordered. [MT]  1030 Troponin I (High Sensitivity)(!!): 1,805 [MT]  1037 Cards consulted will see pt [MT]  1039 Repeat EKG shows no significant or acute ischemic changes, continues to have some minor peaked T waves in the anterior leads, no ST elevations or ST depressions. [MT]  1039 Patient reports he has minimal to no pain while he is completely at rest at the bed.  Heparin ordered. [MT]    Clinical Course  User Index [MT] Saban Heinlen, Kermit Balo, MD                           Medical Decision Making Amount and/or Complexity of Data Reviewed Labs: ordered. Decision-making details documented in ED Course. Radiology: ordered. ECG/medicine tests: ordered.  Risk OTC drugs. Prescription drug management. Decision regarding hospitalization.   This patient presents to the ED with concern for chest pain and epigastric pain. This involves an extensive number of treatment options, and is a complaint that carries with it a high risk of complications and morbidity.  The differential diagnosis includes ACS versus gastritis versus reflux versus pneumonia versus pneumothorax versus other  His initial presentation suggest the possibility of reflux or gastritis, given his history, as well as epigastric discomfort on exam.  His symptoms are also worse at night, which be more suggestive of reflux.  We can try GI cocktail first here.  However, I cannot exclude the possibility that there is coronary disease, particularly as there is clearly an exertional component to his symptoms as well.  He has been noncompliant with his medications, has not had follow up care (or any PCP care) in 5-7 years, and he may have untreated hypertension and hyperlipidemia, and is also an ongoing smoker.  I will consult cardiology.  Co-morbidities that complicate the patient evaluation: History of coronary disease, smoking, at high risk for recurring cardiovascular disease  Additional history obtained from patient's wife at bedside  External records from outside source obtained and reviewed including last cardiology note from 2014, Dr. Tonny Bollman, reporting the patient had a history of acute MI in 2011 with PCI to left circumflex and LAD.  There is also report of a history of hyperlipidemia and patient having statin intolerance.  There is report of some possible referred cervical spinal pain.  I ordered and personally interpreted labs.  The  pertinent results include: CMP and CBC largely unremarkable, initial troponin elevated at 1800  I ordered imaging studies including x-ray of the chest I independently visualized and interpreted imaging which showed no focal infiltrate or pneumothorax I agree with the radiologist interpretation  The patient was maintained on a cardiac monitor.  I personally viewed and interpreted the cardiac monitored which showed  an underlying rhythm of: Normal sinus  Per my interpretation the patient's ECG shows normal sinus rhythm with no significant changes from prior tracings, some mildly more acute T waves in V3, nonspecific.  No ST elevations or depressions.  I ordered medication including GI medications for possible gastritis or reflux. I have reviewed the patients home medicines and have made adjustments as needed  Test Considered: Lower suspicion for acute PE with this clinical presentation  I requested consultation with the cardiology,  and discussed lab and imaging findings as well as pertinent plan - they recommend: consult pending, see ED course  After the interventions noted above, I reevaluated the patient and found that they have: improved  Patient reports that he is pain-free while sitting at rest on the bed on my reassessment  Social Determinants of Health:Counseled on smoking cessation  Dispostion:  After consideration of the diagnostic results and the patients response to treatment, I feel that the patent would benefit from medical admission.   {Document critical care time when appropriate:1} {Document review of labs and clinical decision tools ie heart score, Chads2Vasc2 etc:1}  {Document your independent review of radiology images, and any outside records:1} {Document your discussion with family members, caretakers, and with consultants:1} {Document social determinants of health affecting pt's care:1} {Document your decision making why or why not admission, treatments were  needed:1} Final Clinical Impression(s) / ED Diagnoses Final diagnoses:  NSTEMI (non-ST elevated myocardial infarction) (HCC)  Chest pain, unspecified type    Rx / DC Orders ED Discharge Orders     None

## 2021-08-07 NOTE — Progress Notes (Signed)
From a quick discussion at shift change, patient did not seem completely against the idea of bypass surgery, but he is very concerned about affording medications especially long term, which he claims is part of why he stopped other medications in the past and has delayed some of his care.

## 2021-08-07 NOTE — H&P (Signed)
Cardiology Admission History and Physical:   Patient ID: Russell Miller MRN: [id number]; DOB: [birth year]   Admission date: 08/07/2021  PCP:  Patient, No Pcp Per   [facility] Providers Cardiologist:  None     Chief Complaint:  Chest pain, NSTEMI   Patient Profile:   Russell Miller is a 51 y.o. male with a past medical history of CAD (s/p MI 2011), HTN, HLD, statin intolerance who is being seen 08/07/2021 for the evaluation of chest pain, elevated troponins.  History of Present Illness:   Russell Miller is a 51 year old male with above medical history.  Patient was previously followed by [name] until 2015 when patient was lost to follow-up.  Per chart review, patient had a STEMI in 02/2009.  Underwent left heart catheterization that showed distal RCA 40-50%, proximal D1 70%, LAD 80% then 30-40%, proximal CFX occluded.  EF was 50%, inferolateral AK.  Underwent primary PCI to the Lcx, staged PCI of the LAD.  Later, in 05/2011, patient had a nuclear stress test that was a low risk study.  Patient was last seen by cardiology in 2015.  Does not appear to have been seen by PCP since 2018.  Patient presented to the ED on 7/28 complaining of chest pain that began on Tuesday 7/25.  Pain at radiates to his back and is associated with bilateral arm tingling.  Exertion makes the pain worse.  Took an old nitro that he had, no relief. Labs in the ED showed Na 136, K 4.4, creatinine 0.86, WBC 12.6, hemoglobin 16.7, platelets 264. Initial hsTn 1800.  CXR without acute cardiopulmonary process.  EKG with sinus rhtyhm, peaked T waves in V3, V4. Wandering basline in leads II, III, aVF.   Patient was given ASA 325 in the ED and was given SL nitro. ED ordered pharmacy consult for heparin.   On interview, patient reports that after his heart attack in 2011, he took his heart medicines for about 2 years then quit.  For the past several years, patient has had some degree of chest pain on exertion while  mowing his lawn.  Chest pain felt like pressure, however it was somewhat mild and was always relieved by rest.  For the past few months, the chest pressure has gotten more intense but was still only on exertion and was relieved by rest.  Chest pressure was accompanied with bilateral arm numbness.  On 7/25, patient was sitting on his deck when he had a sudden onset of very severe chest pressure.  Pressure occurred while he was at rest, located in the middle of his chest, radiated to neck, bilateral arms, back.  Pain was very similar to what he had prior to what he felt prior to his STEMI, however less intense.  Since 7/25, patient has continued to have intermittent chest pain.  Occasionally has chest pain while at rest.  Denies any shortness of breath, nausea, vomiting, diaphoresis, anxiety.  Occasionally has a mild headache.  At the time of my evaluation, patient was chest pain-free.  However did start to feel some chest pressure during our conversation.  Patient admits to several years of cigarette use.  Also admits to heavy drinking, occasionally up to 12 beers per night.  Last drink on Tuesday.  Reports that he has never had withdrawal symptoms before.  Patient does not take any medications at home, not on any blood thinners.  Past Medical History:  Diagnosis Date   CAD (coronary artery disease) 2011   LCx  infarction treated with DES and staged PCI of severe prox LAD stenosis treated with  DES.   Hyperglycemia    Hyperlipidemia    Ischemic cardiomyopathy    mild, with LVEF greater than 45%   Tobacco abuse     Past Surgical History:  Procedure Laterality Date   ANGIOPLASTY / STENTING FEMORAL     CAROTID STENT     of the mid left anterior descending with a single drug-eluting stent     Medications Prior to Admission: Prior to Admission medications   Medication Sig Start Date End Date Taking? Authorizing Provider  aspirin 81 MG tablet Take 81 mg by mouth daily.    [provider]   HYDROcodone-acetaminophen (NORCO/VICODIN) 5-325 MG tablet Take 1 tablet by mouth every 6 (six) hours as needed for moderate pain or severe pain. 07/13/19   Fayrene Helper, PA-C  ibuprofen (ADVIL,MOTRIN) 600 MG tablet Take 1 tablet (600 mg total) by mouth every 6 (six) hours as needed. Patient not taking: Reported on 10/16/2016 10/03/16   Elpidio Anis, PA-C  meloxicam (MOBIC) 7.5 MG tablet Take 1 tablet (7.5 mg total) by mouth daily. Patient not taking: Reported on 10/16/2016 03/11/14   Sherren Mocha, MD  metoprolol succinate (TOPROL-XL) 25 MG 24 hr tablet TAKE 1 TABLET BY MOUTH DAILY Patient not taking: Reported on 10/16/2016 01/15/14   Tonny Bollman, MD  nitroGLYCERIN (NITROSTAT) 0.4 MG SL tablet Place 1 tablet (0.4 mg total) under the tongue every 5 (five) minutes as needed. Patient not taking: Reported on 10/16/2016 07/25/13   Tereso Newcomer T, PA-C  ondansetron (ZOFRAN ODT) 4 MG disintegrating tablet Take 1 tablet (4 mg total) by mouth every 8 (eight) hours as needed for nausea or vomiting. Patient not taking: Reported on 10/16/2016 10/03/16   Elpidio Anis, PA-C     Allergies:   No Known Allergies  Social History:   Social History   Socioeconomic History   Marital status: Married    Spouse name: Not on file   Number of children: Not on file   Years of education: Not on file   Highest education level: Not on file  Occupational History   Occupation: electrician  Tobacco Use   Smoking status: Former    Types: Cigarettes    Quit date: 01/11/2009    Years since quitting: 12.5   Smokeless tobacco: Never  Substance and Sexual Activity   Alcohol use: No   Drug use: No   Sexual activity: Not on file  Other Topics Concern   Not on file  Social History Narrative   Not on file   Social Determinants of Health   Financial Resource Strain: Not on file  Food Insecurity: Not on file  Transportation Needs: Not on file  Physical Activity: Not on file  Stress: Not on file  Social Connections: Not  on file  Intimate Partner Violence: Not on file    Family History:   The patient's family history includes Cancer in his cousin; Diabetes in his maternal grandmother; Heart attack in his maternal grandmother and mother; Hyperlipidemia in his mother; Hypertension in his mother; Stroke in his maternal grandmother.    ROS:  Please see the history of present illness.  All other ROS reviewed and negative.     Physical Exam/Data:   Vitals:   08/07/21 0945 08/07/21 1000 08/07/21 1015 08/07/21 1030  BP: (!) 136/103 (!) 150/101 135/89 123/83  Pulse: 73 83 80 86  Resp: (!) 23 16  19   Temp:  TempSrc:      SpO2: 95% 96% 97% 97%  Weight:   90.7 kg   Height:   5\' 11"  (1.803 m)    No intake or output data in the 24 hours ending 08/07/21 1120    08/07/2021   10:15 AM 07/13/2019    7:43 AM 10/16/2016    9:30 AM  Last 3 Weights  Weight (lbs) 200 lb 190 lb 188 lb  Weight (kg) 90.719 kg 86.183 kg 85.276 kg     Body mass index is 27.89 kg/m.  General:  Well nourished, well developed, in no acute distress. Middle aged man laying comfortably in the bed  HEENT: normal Neck: no JVD Vascular: Radial pulses 2+ bilaterally   Cardiac:  normal S1, S2; RRR;  grade 3/6 systolic murmur at apex. Chest wall not tender to palpation  Lungs:  clear to auscultation bilaterally, no wheezing, rhonchi or rales. Normal WOB  Abd: soft, nontender, no hepatomegaly  Ext: no edema Musculoskeletal:  No deformities, BUE and BLE strength normal and equal Skin: warm and dry  Neuro:  CNs 2-12 intact, no focal abnormalities noted Psych:  Normal affect    EKG:  The ECG that was done 7/28 was personally reviewed and demonstrates sinus rhtyhm, peaked T waves in V3, V4. Wandering basline in leads II, III, aVF  Relevant CV Studies:   Laboratory Data:  High Sensitivity Troponin:   Recent Labs  Lab 08/07/21 0857  TROPONINIHS 1,805*      Chemistry Recent Labs  Lab 08/07/21 0857  NA 136  K 4.4  CL 105  CO2 25   GLUCOSE 96  BUN 5*  CREATININE 0.86  CALCIUM 9.0  GFRNONAA >60  ANIONGAP 6    Recent Labs  Lab 08/07/21 0857  PROT 7.1  ALBUMIN 3.7  AST 19  ALT 33  ALKPHOS 66  BILITOT 1.3*   Lipids No results for input(s): "CHOL", "TRIG", "HDL", "LABVLDL", "LDLCALC", "CHOLHDL" in the last 168 hours. Hematology Recent Labs  Lab 08/07/21 0857  WBC 12.6*  RBC 5.24  HGB 16.7  HCT 48.0  MCV 91.6  MCH 31.9  MCHC 34.8  RDW 12.1  PLT 264   Thyroid No results for input(s): "TSH", "FREET4" in the last 168 hours. BNPNo results for input(s): "BNP", "PROBNP" in the last 168 hours.  DDimer No results for input(s): "DDIMER" in the last 168 hours.   Radiology/Studies:  DG Chest 2 View  Result Date: 08/07/2021 CLINICAL DATA:  Chest pain EXAM: CHEST - 2 VIEW COMPARISON:  Chest radiograph 03/09/2009 FINDINGS: The cardiomediastinal silhouette is normal. There is no focal consolidation or pulmonary edema. There is no pleural effusion or pneumothorax There is no acute osseous abnormality. IMPRESSION: No radiographic evidence of acute cardiopulmonary process. Electronically Signed   By: 03/11/2009 M.D.   On: 08/07/2021 08:52     Assessment and Plan:   NSTEMI  CAD s/p STEMI in 2001 with PCI to LAD, Lcx - Patient presented complaining of several months of progressive chest pain on exertion. On 7/25, patient was at rest when he developed severe chest pressure. Similar to pain felt prior to STEMI in 2011. Has continued to have intermittent chest pain at rest/on very minimal exertion since 7/25. Pain was so severe this AM it made him cry  - Patient has been noncompliant with heart medications for several years  - hsTn elevated to 1800 on presentation  - Started on IV heparin in the ED  - Patient agreeable to cath  this admission, can likely be done today  - Ordered lipid panel, TSH, A1c to assess risk factors, especially as patient does not routinely see PCP (has not been seen since 2018)  - Start  metoprolol 12.5 mg BID (BP 123/83, titrate as able)  - Patient in the past has not tolerated crestor due to muscle aches, attempt treatment with lipitor 20 mg daily for now  - Given ASA 325 in the ED, start daily ASA 81 mg tomorrow  - Echocardiogram ordered   Cardiac murmur - Patient with grade 3/6 systolic murmur at apex  - Echocardiogram ordered   Alcohol Abuse  - Patient admits to drinking up to 12 beers per evening. Has not had anything to drink since 7/25 - Denies every having withdrawal symptoms before. Denies any current withdrawal symptoms  - Initiated CIWA protocol   Tobacco use - Encouraged cessation    Risk Assessment/Risk Scores:   TIMI Risk Score for Unstable Angina or Non-ST Elevation MI:   The patient's TIMI risk score is 3, which indicates a 13% risk of all cause mortality, new or recurrent myocardial infarction or need for urgent revascularization in the next 14 days.        Severity of Illness: The appropriate patient status for this patient is INPATIENT. Inpatient status is judged to be reasonable and necessary in order to provide the required intensity of service to ensure the patient's safety. The patient's presenting symptoms, physical exam findings, and initial radiographic and laboratory data in the context of their chronic comorbidities is felt to place them at high risk for further clinical deterioration. Furthermore, it is not anticipated that the patient will be medically stable for discharge from the hospital within 2 midnights of admission.   * I certify that at the point of admission it is my clinical judgment that the patient will require inpatient hospital care spanning beyond 2 midnights from the point of admission due to high intensity of service, high risk for further deterioration and high frequency of surveillance required.*   For questions or updates, please contact [facility] Please consult [email or web address] for contact info under      Signed, Jonita Albee, PA-C  08/07/2021 11:20 AM

## 2021-08-07 NOTE — Progress Notes (Signed)
Per Dr. Gibson Ramp request, CVTS consult called for CABGPearletha Alfred acknowledged receipt of consult.

## 2021-08-08 DIAGNOSIS — I214 Non-ST elevation (NSTEMI) myocardial infarction: Secondary | ICD-10-CM | POA: Diagnosis not present

## 2021-08-08 LAB — HEPARIN LEVEL (UNFRACTIONATED)
Heparin Unfractionated: 0.11 IU/mL — ABNORMAL LOW (ref 0.30–0.70)
Heparin Unfractionated: 0.25 IU/mL — ABNORMAL LOW (ref 0.30–0.70)
Heparin Unfractionated: 0.39 IU/mL (ref 0.30–0.70)

## 2021-08-08 LAB — TSH: TSH: 2.612 u[IU]/mL (ref 0.350–4.500)

## 2021-08-08 LAB — LIPID PANEL
Cholesterol: 247 mg/dL — ABNORMAL HIGH (ref 0–200)
HDL: 39 mg/dL — ABNORMAL LOW (ref >40)
LDL Cholesterol: 163 mg/dL — ABNORMAL HIGH (ref 0–99)
Total CHOL/HDL Ratio: 6.3 RATIO
Triglycerides: 227 mg/dL — ABNORMAL HIGH (ref <150)
VLDL: 45 mg/dL — ABNORMAL HIGH (ref 0–40)

## 2021-08-08 LAB — BASIC METABOLIC PANEL
Anion gap: 7 (ref 5–15)
BUN: 6 mg/dL (ref 6–20)
CO2: 27 mmol/L (ref 22–32)
Calcium: 9.1 mg/dL (ref 8.9–10.3)
Chloride: 104 mmol/L (ref 98–111)
Creatinine, Ser: 0.78 mg/dL (ref 0.61–1.24)
GFR, Estimated: >60 mL/min (ref >60)
Glucose, Bld: 111 mg/dL — ABNORMAL HIGH (ref 70–99)
Potassium: 3.9 mmol/L (ref 3.5–5.1)
Sodium: 138 mmol/L (ref 135–145)

## 2021-08-08 LAB — CBC
HCT: 45.4 % (ref 39.0–52.0)
Hemoglobin: 16 g/dL (ref 13.0–17.0)
MCH: 32 pg (ref 26.0–34.0)
MCHC: 35.2 g/dL (ref 30.0–36.0)
MCV: 90.8 fL (ref 80.0–100.0)
Platelets: 239 10*3/uL (ref 150–400)
RBC: 5 MIL/uL (ref 4.22–5.81)
RDW: 12.2 % (ref 11.5–15.5)
WBC: 9.6 10*3/uL (ref 4.0–10.5)
nRBC: 0 % (ref 0.0–0.2)

## 2021-08-08 LAB — HIV ANTIBODY (ROUTINE TESTING W REFLEX): HIV Screen 4th Generation wRfx: NONREACTIVE

## 2021-08-08 LAB — MAGNESIUM: Magnesium: 2.4 mg/dL (ref 1.7–2.4)

## 2021-08-08 LAB — HEMOGLOBIN A1C
Hgb A1c MFr Bld: 5.8 % — ABNORMAL HIGH (ref 4.8–5.6)
Mean Plasma Glucose: 119.76 mg/dL

## 2021-08-08 NOTE — Progress Notes (Signed)
Cardiac Rehab Phase 1 Attempted to see patient for ambulation. Per patient it hurts when he walks. He wants to talk with the physician first. Will continue to follow.  Artist Pais, MS, ACSM [employer] 08/08/2021 [phone]

## 2021-08-08 NOTE — Progress Notes (Deleted)
ANTICOAGULATION CONSULT NOTE- Follow Up  Pharmacy Consult for heparin Indication: chest pain/ACS  Heparin Dosing Weight: 90.7 kg  Labs: Recent Labs    08/07/21 0857 08/08/21 0114 08/08/21 0948 08/08/21 1726  HGB 16.7 16.0  --   --   HCT 48.0 45.4  --   --   PLT 264 239  --   --   HEPARINUNFRC  --  0.11* 0.25* 0.39  CREATININE 0.86 0.78  --   --      Assessment: 50 yom with PMH CAD, HTN, HLD presenting with CP, elevated troponin. Pharmacy consulted to dose heparin for NSTEMI. Patient was not on anticoagulation PTA.   S/p cardiac cath finding severe three-vessel CAD- recommend to have patient undergo CABG.   Heparin increased to 1550 units/hr earlier today secondary to sub-therapeutic level. Repeat heparin level back at 0.39 which is therapeutic  No issued with IV infusion or access per RN. No bleeding noted.   Hgb 0.39; plt 239 hsTrop 1922  Goal of Therapy:  Heparin level 0.3-0.7 units/ml Monitor platelets by anticoagulation protocol: Yes   Plan:  Resume heparin infusion at 1550 units/hr Confirm heparin level at 0000 Monitor daily heparin level and CBC, s/sx bleeding  Russell Miller, PharmD, BCPS 08/08/2021 8:36 PM ED Clinical Pharmacist -  [phone]

## 2021-08-08 NOTE — Progress Notes (Signed)
ANTICOAGULATION CONSULT NOTE- Follow Up  Pharmacy Consult for heparin Indication: chest pain/ACS  Heparin Dosing Weight: 90.7 kg  Labs: Recent Labs    08/07/21 0857 08/08/21 0114 08/08/21 0948 08/08/21 1726  HGB 16.7 16.0  --   --   HCT 48.0 45.4  --   --   PLT 264 239  --   --   HEPARINUNFRC  --  0.11* 0.25* 0.39  CREATININE 0.86 0.78  --   --      Assessment: 50 yom with PMH CAD, HTN, HLD presenting with CP, elevated troponin. Pharmacy consulted to dose heparin for NSTEMI. Patient is not on anticoagulation PTA.   Underwent cardiac cath finding severe three-vessel CAD- recommend to have patient undergo CABG. Heparin level of 0.39 is therapeutic on heparin 1550 units/hr. Hgb and plts WNL. No issued with IV infusion or access per RN. No bleeding noted.   Goal of Therapy:  Heparin level 0.3-0.7 units/ml Monitor platelets by anticoagulation protocol: Yes   Plan:  Continue heparin drip 1550 units/hr Monitor daily heparin level and CBC, s/sx bleeding    Leota Sauers Pharm.D. CPP, BCPS Clinical Pharmacist [phone] 08/08/2021 8:36 PM

## 2021-08-08 NOTE — Progress Notes (Signed)
Progress Note  Patient Name: ROBBERT LANGLINAIS Date of Encounter: 08/08/2021  [facility] Cardiologist: None   Subjective   No current chest pain.  Patient does state that if he walked 100 feet that he would develop pain.  Inpatient Medications    Scheduled Meds:  aspirin  325 mg Oral Daily   atorvastatin  20 mg Oral Daily   folic acid  1 mg Oral Daily   LORazepam  0-4 mg Oral Q6H   Followed by   Melene Muller ON 08/09/2021] LORazepam  0-4 mg Oral Q12H   metoprolol tartrate  12.5 mg Oral BID   multivitamin with minerals  1 tablet Oral Daily   sodium chloride flush  3 mL Intravenous Q12H   sodium chloride flush  3 mL Intravenous Q12H   thiamine  100 mg Oral Daily   Or   thiamine  100 mg Intravenous Daily   Continuous Infusions:  sodium chloride     heparin 1,400 Units/hr (08/08/21 0333)   PRN Meds: sodium chloride, acetaminophen, LORazepam **OR** LORazepam, nitroGLYCERIN, ondansetron (ZOFRAN) IV, sodium chloride flush   Vital Signs    Vitals:   08/07/21 1314 08/07/21 1707 08/07/21 2044 08/08/21 0337  BP: (!) 151/96 117/80 (!) 134/93 111/84  Pulse: 92 92 89 78  Resp: 13 20 19    Temp:  98.3 F (36.8 C) 98.3 F (36.8 C) 98.5 F (36.9 C)  TempSrc:  Oral Oral Oral  SpO2: 98% 99%  98%  Weight:      Height:        Intake/Output Summary (Last 24 hours) at 08/08/2021 0953 Last data filed at 08/08/2021 0333 Gross per 24 hour  Intake 547.78 ml  Output --  Net 547.78 ml      08/07/2021   10:15 AM 07/13/2019    7:43 AM 10/16/2016    9:30 AM  Last 3 Weights  Weight (lbs) 200 lb 190 lb 188 lb  Weight (kg) 90.719 kg 86.183 kg 85.276 kg      Telemetry    Sinus rhythm- Personally Reviewed  ECG    None new- Personally Reviewed  Physical Exam   GEN: No acute distress.   Neck: No JVD Cardiac: RRR, no murmurs, rubs, or gallops.  Respiratory: Clear to auscultation bilaterally. GI: Soft, nontender, non-distended  MS: No edema; No deformity. Neuro:  Nonfocal  Psych:  Normal affect   Labs    High Sensitivity Troponin:   Recent Labs  Lab 08/07/21 0857 08/07/21 1031  TROPONINIHS 1,805* 1,922*     Chemistry Recent Labs  Lab 08/07/21 0857 08/08/21 0114  NA 136 138  K 4.4 3.9  CL 105 104  CO2 25 27  GLUCOSE 96 111*  BUN 5* 6  CREATININE 0.86 0.78  CALCIUM 9.0 9.1  MG  --  2.4  PROT 7.1  --   ALBUMIN 3.7  --   AST 19  --   ALT 33  --   ALKPHOS 66  --   BILITOT 1.3*  --   GFRNONAA >60 >60  ANIONGAP 6 7    Lipids  Recent Labs  Lab 08/08/21 0114  CHOL 247*  TRIG 227*  HDL 39*  LDLCALC 163*  CHOLHDL 6.3    Hematology Recent Labs  Lab 08/07/21 0857 08/08/21 0114  WBC 12.6* 9.6  RBC 5.24 5.00  HGB 16.7 16.0  HCT 48.0 45.4  MCV 91.6 90.8  MCH 31.9 32.0  MCHC 34.8 35.2  RDW 12.1 12.2  PLT 264 239  Thyroid  Recent Labs  Lab 08/08/21 0114  TSH 2.612    BNPNo results for input(s): "BNP", "PROBNP" in the last 168 hours.  DDimer No results for input(s): "DDIMER" in the last 168 hours.   Radiology    ECHOCARDIOGRAM COMPLETE  Result Date: 08/07/2021    ECHOCARDIOGRAM REPORT   Patient Name:   WILBERN PENNYPACKER Gelardi Date of Exam: 08/07/2021 Medical Rec #:  [id number]       Height:       71.0 in Accession #:    [id number]      Weight:       200.0 lb Date of Birth:  [birth year]      BSA:          2.109 m Patient Age:    51 years        BP:           120/79 mmHg Patient Gender: M               HR:           87 bpm. Exam Location:  Inpatient Procedure: 2D Echo, Cardiac Doppler and Color Doppler Indications:    NSTEMI I21.4  History:        Patient has no prior history of Echocardiogram examinations.                 Cardiomyopathy, Previous Myocardial Infarction and CAD; Risk                 Factors:Dyslipidemia and Current Smoker.  Sonographer:    Lucendia Herrlich Referring Phys: [id number] Jonita Albee IMPRESSIONS  1. Windows are not well visualized. hypokinesis of the inferolateral/inferior walls. Left ventricular ejection fraction, by  estimation, is 50 to 55%. The left ventricle has low normal function. There is mild left ventricular hypertrophy. Left ventricular diastolic parameters are consistent with Grade II diastolic dysfunction (pseudonormalization).  2. Right ventricular systolic function is normal. The right ventricular size is normal. Tricuspid regurgitation signal is inadequate for assessing PA pressure.  3. 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 Mild mitral valve regurgitation.  4. The aortic valve is tricuspid. Aortic valve regurgitation is not visualized.  5. The inferior vena cava is normal in size with greater than 50% respiratory variability, suggesting right atrial pressure of 3 mmHg. Comparison(s): No prior Echocardiogram. FINDINGS  Left Ventricle: Windows are not well visualized. hypokinesis of the inferolateral/inferior walls. Left ventricular ejection fraction, by estimation, is 50 to 55%. The left ventricle has low normal function. The left ventricular internal cavity size was normal in size. There is mild left ventricular hypertrophy. Left ventricular diastolic parameters are consistent with Grade II diastolic dysfunction (pseudonormalization). Right Ventricle: The right ventricular size is normal. Right ventricular systolic function is normal. Tricuspid regurgitation signal is inadequate for assessing PA pressure. Left Atrium: Left atrial size was normal in size. Right Atrium: Right atrial size was normal in size. Pericardium: There is no evidence of pericardial effusion. 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 There is mild thickening of the mitral valve leaflet(s). Mild mitral valve regurgitation. Tricuspid Valve: Tricuspid valve regurgitation is not demonstrated. Aortic Valve: The aortic valve is tricuspid. Aortic valve regurgitation is not visualized. Pulmonic Valve: Pulmonic valve regurgitation is not visualized. Aorta: The aortic root and ascending aorta are structurally normal, with no evidence of dilitation.  Venous: The inferior vena cava is normal in size with greater than 50% respiratory variability, suggesting right atrial pressure of 3 mmHg. IAS/Shunts: No atrial level shunt detected by  color flow Doppler.  LEFT VENTRICLE PLAX 2D LVIDd:         1.95 cm   Diastology LVIDs:         4.25 cm   LV e' medial:    6.74 cm/s LV PW:         4.18 cm   LV E/e' medial:  13.8 LV IVS:        0.90 cm   LV e' lateral:   8.16 cm/s LVOT diam:     2.40 cm   LV E/e' lateral: 11.4 LV SV:         78 LV SV Index:   37 LVOT Area:     4.52 cm  RIGHT VENTRICLE TAPSE (M-mode): 2.1 cm LEFT ATRIUM             Index        RIGHT ATRIUM          Index LA diam:        3.60 cm 1.71 cm/m   RA Area:     9.95 cm LA Vol (A2C):   29.3 ml 13.90 ml/m  RA Volume:   20.60 ml 9.77 ml/m LA Vol (A4C):   45.9 ml 21.77 ml/m LA Biplane Vol: 38.2 ml 18.12 ml/m  AORTIC VALVE LVOT Vmax:   116.00 cm/s LVOT Vmean:  69.700 cm/s LVOT VTI:    0.172 m  AORTA Ao Root diam: 2.90 cm Ao Asc diam:  2.90 cm MITRAL VALVE MV Area (PHT): 6.48 cm    SHUNTS MV Decel Time: 117 msec    Systemic VTI:  0.17 m MV E velocity: 93.00 cm/s  Systemic Diam: 2.40 cm MV A velocity: 88.30 cm/s MV E/A ratio:  1.05 Mary Scientist, physiological signed by Phineas Inches Signature Date/Time: 08/07/2021/4:18:31 PM    Final    CARDIAC CATHETERIZATION  Result Date: 08/07/2021   Prox RCA to Mid RCA lesion is 100% stenosed.   Mid Cx to Dist Cx lesion is 99% stenosed.   2nd Mrg lesion is 99% stenosed.   Ost Cx to Prox Cx lesion is 70% stenosed.   1st Diag lesion is 90% stenosed.   Mid LAD-1 lesion is 90% stenosed.   Mid LAD-2 lesion is 50% stenosed.   There is mild left ventricular systolic dysfunction.   LV end diastolic pressure is normal.   The left ventricular ejection fraction is 45-50% by visual estimate. Severe triple vessel CAD The LAD is a large caliber vessel that courses to the apex. The mid LAD stented segment has severe restenosis. There is moderate stenosis just beyond the stented  segment. The large caliber diagonal branch has severe ostial stenosis. The Circumflex is a large caliber vessel with moderately severe ostial stenosis. The mid stented segment involving the obtuse marginal branch has severe stent restenosis with TIMI-2 flow beyond the stent. The large, dominant RCA is completely occluded in the mid segment. The distal vessel and branches fill from left to right collaterals. Mild LV systolic dysfunction with inferior wall hypokinesis. Normal LVEDP Recommendations: Given severe triple vessel CAD including severe stent restenosis, medical non-compliance and ongoing tobacco abuse, I think bypass surgery is his best option for complete revascularization and long term patency. Yonael Tulloch continue ASA, statin, Resume IV heparin drip 2 hours post sheath pull. Echo pending. Tony Friscia consult CT surgery to discuss CABG.   DG Chest 2 View  Result Date: 08/07/2021 CLINICAL DATA:  Chest pain EXAM: CHEST - 2 VIEW COMPARISON:  Chest radiograph 03/09/2009 FINDINGS:  The cardiomediastinal silhouette is normal. There is no focal consolidation or pulmonary edema. There is no pleural effusion or pneumothorax There is no acute osseous abnormality. IMPRESSION: No radiographic evidence of acute cardiopulmonary process. Electronically Signed   By: Valetta Mole M.D.   On: 08/07/2021 08:52    Cardiac Studies   LHC    Prox RCA to Mid RCA lesion is 100% stenosed.   Mid Cx to Dist Cx lesion is 99% stenosed.   2nd Mrg lesion is 99% stenosed.   Ost Cx to Prox Cx lesion is 70% stenosed.   1st Diag lesion is 90% stenosed.   Mid LAD-1 lesion is 90% stenosed.   Mid LAD-2 lesion is 50% stenosed.   There is mild left ventricular systolic dysfunction.   LV end diastolic pressure is normal.   The left ventricular ejection fraction is 45-50% by visual estimate.   TTE   1. Windows are not well visualized. hypokinesis of the  inferolateral/inferior walls. Left ventricular ejection fraction, by  estimation, is 50  to 55%. The left ventricle has low normal function.  There is mild left ventricular hypertrophy. Left  ventricular diastolic parameters are consistent with Grade II diastolic  dysfunction (pseudonormalization).   2. Right ventricular systolic function is normal. The right ventricular  size is normal. Tricuspid regurgitation signal is inadequate for assessing  PA pressure.   3. 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 Mild mitral valve regurgitation.   4. The aortic valve is tricuspid. Aortic valve regurgitation is not  visualized.   5. The inferior vena cava is normal in size with greater than 50%  respiratory variability, suggesting right atrial pressure of 3 mmHg.   Patient Profile     51 y.o. male with a history of alcohol abuse, tobacco abuse, coronary artery disease presented to the hospital with non-STEMI  Assessment & Plan    1.  Non-STEMI: Patient has significant coronary artery disease.  On catheterization, he has 90% to 100% occlusions of all of his major vessels.  He has been recommended for CABG, though he was quite nervous about surgery and wanted to see if stenting would be an option.  At this point, it is quite obvious that CABG is his best option.  I discussed this with the patient and his significant other.  They Haylea Schlichting discuss it further and Mihaela Fajardo let the nurse know whether or not he wants to proceed with CABG.  2.  Alcohol abuse: Complete cessation encouraged.  Continue CIWA protocol.  3.  Tobacco abuse: Complete cessation encouraged  For questions or updates, please contact [place] Please consult [email or web address] for contact info under        Signed, Jsean Taussig Meredith Leeds, MD  08/08/2021, 9:53 AM

## 2021-08-08 NOTE — Progress Notes (Signed)
ANTICOAGULATION CONSULT NOTE  Pharmacy Consult for heparin Indication: chest pain/ACS  Heparin Dosing Weight: 90.7 kg  Labs: Recent Labs    08/07/21 0857 08/08/21 0114  HGB 16.7 16.0  HCT 48.0 45.4  PLT 264 239  HEPARINUNFRC  --  0.11*  CREATININE 0.86 0.78     Assessment: 50 yom with PMH CAD, HTN, HLD presenting with CP, elevated troponin. Pharmacy consulted to dose heparin for NSTEMI. Patient is not on anticoagulation PTA.   Underwent cardiac cath finding severe three-vessel CAD- plan for CTVS evaluation. Okay to restart heparin 2 hours after TR band removed (documented removed on 7/28 at 1640). No s/sx of bleeding outside of at TR while trying to remove. CBC WNL.   Heparin level of 0.11 is subtherapeutic on heparin 1150 units/hr. No issued with IV infusion or access per RN. No bleeding noted.   Goal of Therapy:  Heparin level 0.3-0.7 units/ml Monitor platelets by anticoagulation protocol: Yes   Plan:  Increase heparin to 1400 units/hr Check 6hr heparin level Monitor daily heparin level and CBC, s/sx bleeding F/u CTVS plans  Gerrit Halls, PharmD, BCPS Clinical Pharmacist 08/08/2021 3:00 AM

## 2021-08-08 NOTE — Progress Notes (Signed)
      [address]       [place] [address]             [phone]      Up in chair. Feels "fine" denies CP  BP (!) 128/93 (BP Location: Left Arm)   Pulse 75   Temp 97.9 F (36.6 C) (Oral)   Resp 19   Ht 5\' 11"  (1.803 m)   Wt 90.7 kg   SpO2 98%   BMI 27.89 kg/m   Exam unchanged  He has decided to go ahead with CABG  Will have a better idea tomorrow when it can be done  [place] C. Josehaven, MD [facility] [phone]

## 2021-08-08 NOTE — Progress Notes (Addendum)
ANTICOAGULATION CONSULT NOTE- Follow Up  Pharmacy Consult for heparin Indication: chest pain/ACS  Heparin Dosing Weight: 90.7 kg  Labs: Recent Labs    08/07/21 0857 08/08/21 0114 08/08/21 0948  HGB 16.7 16.0  --   HCT 48.0 45.4  --   PLT 264 239  --   HEPARINUNFRC  --  0.11* 0.25*  CREATININE 0.86 0.78  --      Assessment: 50 yom with PMH CAD, HTN, HLD presenting with CP, elevated troponin. Pharmacy consulted to dose heparin for NSTEMI. Patient is not on anticoagulation PTA.   Underwent cardiac cath finding severe three-vessel CAD- recommend to have patient undergo CABG. Heparin level of 0.25 is subtherapeutic on heparin 1400 units/hr. Hgb and plts WNL. No issued with IV infusion or access per RN. No bleeding noted.   Goal of Therapy:  Heparin level 0.3-0.7 units/ml Monitor platelets by anticoagulation protocol: Yes   Plan:  Increase heparin to 1550 units/hr Check 6hr heparin level Monitor daily heparin level and CBC, s/sx bleeding  Arabella Merles, PharmD. [facility] Acute Care PGY-1  08/08/2021 11:05 AM

## 2021-08-09 ENCOUNTER — Inpatient Hospital Stay (HOSPITAL_COMMUNITY): Payer: Managed Care, Other (non HMO)

## 2021-08-09 DIAGNOSIS — I214 Non-ST elevation (NSTEMI) myocardial infarction: Secondary | ICD-10-CM | POA: Diagnosis not present

## 2021-08-09 DIAGNOSIS — Z01810 Encounter for preprocedural cardiovascular examination: Secondary | ICD-10-CM

## 2021-08-09 DIAGNOSIS — I25110 Atherosclerotic heart disease of native coronary artery with unstable angina pectoris: Secondary | ICD-10-CM | POA: Diagnosis not present

## 2021-08-09 LAB — CBC
HCT: 43.7 % (ref 39.0–52.0)
Hemoglobin: 15.3 g/dL (ref 13.0–17.0)
MCH: 31.9 pg (ref 26.0–34.0)
MCHC: 35 g/dL (ref 30.0–36.0)
MCV: 91.2 fL (ref 80.0–100.0)
Platelets: 222 10*3/uL (ref 150–400)
RBC: 4.79 MIL/uL (ref 4.22–5.81)
RDW: 12.1 % (ref 11.5–15.5)
WBC: 9.7 10*3/uL (ref 4.0–10.5)
nRBC: 0 % (ref 0.0–0.2)

## 2021-08-09 LAB — LIPOPROTEIN A (LPA): Lipoprotein (a): 51.6 nmol/L — ABNORMAL HIGH (ref <75.0)

## 2021-08-09 LAB — HEPARIN LEVEL (UNFRACTIONATED): Heparin Unfractionated: 0.53 IU/mL (ref 0.30–0.70)

## 2021-08-09 MED ORDER — CHLORHEXIDINE GLUCONATE 0.12 % MT SOLN: 15.0000 mL | Freq: Once | OROMUCOSAL | Status: DC

## 2021-08-09 MED ORDER — EPINEPHRINE HCL 5 MG/250ML IV SOLN IN NS
0.0000 ug/min | INTRAVENOUS | Status: DC
  Filled 2021-08-09: qty 250

## 2021-08-09 MED ORDER — PLASMA-LYTE A IV SOLN
INTRAVENOUS | Status: AC
  Administered 2021-08-10: 300 mL
  Filled 2021-08-09: qty 2.5

## 2021-08-09 MED ORDER — CHLORHEXIDINE GLUCONATE CLOTH 2 % EX PADS
6.0000 | MEDICATED_PAD | Freq: Once | CUTANEOUS | Status: AC
  Administered 2021-08-10: 6 via TOPICAL

## 2021-08-09 MED ORDER — NITROGLYCERIN IN D5W 200-5 MCG/ML-% IV SOLN
2.0000 ug/min | INTRAVENOUS | Status: DC
  Filled 2021-08-09: qty 250

## 2021-08-09 MED ORDER — VANCOMYCIN HCL 1500 MG/300ML IV SOLN
1500.0000 mg | INTRAVENOUS | Status: AC
  Administered 2021-08-10: 1500 mg via INTRAVENOUS
  Filled 2021-08-09: qty 300

## 2021-08-09 MED ORDER — TRANEXAMIC ACID 1000 MG/10ML IV SOLN
1.5000 mg/kg/h | INTRAVENOUS | Status: AC
  Administered 2021-08-10: 1.5 mg/kg/h via INTRAVENOUS
  Filled 2021-08-09: qty 25

## 2021-08-09 MED ORDER — NOREPINEPHRINE 4 MG/250ML-% IV SOLN
0.0000 ug/min | INTRAVENOUS | Status: DC
  Filled 2021-08-09: qty 250

## 2021-08-09 MED ORDER — PHENYLEPHRINE HCL-NACL 20-0.9 MG/250ML-% IV SOLN
30.0000 ug/min | INTRAVENOUS | Status: AC
  Administered 2021-08-10: 25 ug/min via INTRAVENOUS
  Filled 2021-08-09: qty 250

## 2021-08-09 MED ORDER — POTASSIUM CHLORIDE 2 MEQ/ML IV SOLN
80.0000 meq | INTRAVENOUS | Status: DC
  Filled 2021-08-09: qty 40

## 2021-08-09 MED ORDER — MILRINONE LACTATE IN DEXTROSE 20-5 MG/100ML-% IV SOLN
0.3000 ug/kg/min | INTRAVENOUS | Status: DC
  Filled 2021-08-09: qty 100

## 2021-08-09 MED ORDER — ROSUVASTATIN CALCIUM 20 MG PO TABS
40.0000 mg | ORAL_TABLET | Freq: Every day | ORAL | Status: DC
  Administered 2021-08-09 – 2021-08-14 (×5): 40 mg via ORAL
  Filled 2021-08-09 (×5): qty 2

## 2021-08-09 MED ORDER — DIAZEPAM 5 MG PO TABS: 5.0000 mg | ORAL_TABLET | Freq: Once | ORAL | Status: DC

## 2021-08-09 MED ORDER — MAGNESIUM SULFATE 50 % IJ SOLN
40.0000 meq | INTRAMUSCULAR | Status: DC
  Filled 2021-08-09: qty 9.85

## 2021-08-09 MED ORDER — DEXMEDETOMIDINE HCL IN NACL 400 MCG/100ML IV SOLN
0.1000 ug/kg/h | INTRAVENOUS | Status: AC
  Administered 2021-08-10: .4 ug/kg/h via INTRAVENOUS
  Filled 2021-08-09: qty 100

## 2021-08-09 MED ORDER — TRANEXAMIC ACID (OHS) PUMP PRIME SOLUTION
2.0000 mg/kg | INTRAVENOUS | Status: DC
  Filled 2021-08-09: qty 1.81

## 2021-08-09 MED ORDER — CEFAZOLIN SODIUM-DEXTROSE 2-4 GM/100ML-% IV SOLN
2.0000 g | INTRAVENOUS | Status: AC
  Administered 2021-08-10 (×2): 2 g via INTRAVENOUS
  Filled 2021-08-09: qty 100

## 2021-08-09 MED ORDER — CEFAZOLIN SODIUM-DEXTROSE 2-4 GM/100ML-% IV SOLN
2.0000 g | INTRAVENOUS | Status: DC
  Filled 2021-08-09: qty 100

## 2021-08-09 MED ORDER — METOPROLOL TARTRATE 12.5 MG HALF TABLET: 12.5000 mg | ORAL_TABLET | Freq: Once | ORAL | Status: DC

## 2021-08-09 MED ORDER — HEPARIN 30,000 UNITS/1000 ML (OHS) CELLSAVER SOLUTION
Status: DC
  Filled 2021-08-09: qty 1000

## 2021-08-09 MED ORDER — CHLORHEXIDINE GLUCONATE CLOTH 2 % EX PADS: 6.0000 | MEDICATED_PAD | Freq: Once | CUTANEOUS | Status: DC

## 2021-08-09 MED ORDER — INSULIN REGULAR(HUMAN) IN NACL 100-0.9 UT/100ML-% IV SOLN
INTRAVENOUS | Status: AC
  Administered 2021-08-10: 1.5 [IU]/h via INTRAVENOUS
  Filled 2021-08-09: qty 100

## 2021-08-09 MED ORDER — TRANEXAMIC ACID (OHS) BOLUS VIA INFUSION
15.0000 mg/kg | INTRAVENOUS | Status: AC
  Administered 2021-08-10: 1360.5 mg via INTRAVENOUS
  Filled 2021-08-09: qty 1361

## 2021-08-09 NOTE — Progress Notes (Signed)
**Note Russell-Identified via Obfuscation** Progress Note  Patient Name: Russell Miller Date of Encounter: 08/09/2021  [facility] Cardiologist: None   Subjective   No current chest pain.  Had a conversation with cardiac surgery yesterday and has agreed to CABG.  Inpatient Medications    Scheduled Meds:  aspirin  325 mg Oral Daily   atorvastatin  20 mg Oral Daily   folic acid  1 mg Oral Daily   LORazepam  0-4 mg Oral Q6H   Followed by   LORazepam  0-4 mg Oral Q12H   metoprolol tartrate  12.5 mg Oral BID   multivitamin with minerals  1 tablet Oral Daily   sodium chloride flush  3 mL Intravenous Q12H   thiamine  100 mg Oral Daily   Or   thiamine  100 mg Intravenous Daily   Continuous Infusions:  sodium chloride     heparin 1,550 Units/hr (08/09/21 0304)   PRN Meds: sodium chloride, acetaminophen, LORazepam **OR** LORazepam, nitroGLYCERIN, ondansetron (ZOFRAN) IV, sodium chloride flush   Vital Signs    Vitals:   08/08/21 0337 08/08/21 1432 08/08/21 2140 08/09/21 0305  BP: 111/84 (!) 128/93 (!) 149/98 115/89  Pulse: 78 75 88 69  Resp:   20 15  Temp: 98.5 F (36.9 C) 97.9 F (36.6 C) 98.7 F (37.1 C) 98.4 F (36.9 C)  TempSrc: Oral Oral Oral Oral  SpO2: 98%  99% 99%  Weight:      Height:        Intake/Output Summary (Last 24 hours) at 08/09/2021 0826 Last data filed at 08/09/2021 0200 Gross per 24 hour  Intake 716.01 ml  Output --  Net 716.01 ml       08/07/2021   10:15 AM 07/13/2019    7:43 AM 10/16/2016    9:30 AM  Last 3 Weights  Weight (lbs) 200 lb 190 lb 188 lb  Weight (kg) 90.719 kg 86.183 kg 85.276 kg      Telemetry    Sinus rhythm-personally reviewed  ECG    None new  Physical Exam   GEN: Well nourished, well developed, in no acute distress  HEENT: normal  Neck: no JVD, carotid bruits, or masses Cardiac: RRR; no murmurs, rubs, or gallops,no edema  Respiratory:  clear to auscultation bilaterally, normal work of breathing GI: soft, nontender, nondistended, + BS MS: no  deformity or atrophy  Skin: warm and dry Neuro:  Strength and sensation are intact Psych: euthymic mood, full affect   Labs    High Sensitivity Troponin:   Recent Labs  Lab 08/07/21 0857 08/07/21 1031  TROPONINIHS 1,805* 1,922*      Chemistry Recent Labs  Lab 08/07/21 0857 08/08/21 0114  NA 136 138  K 4.4 3.9  CL 105 104  CO2 25 27  GLUCOSE 96 111*  BUN 5* 6  CREATININE 0.86 0.78  CALCIUM 9.0 9.1  MG  --  2.4  PROT 7.1  --   ALBUMIN 3.7  --   AST 19  --   ALT 33  --   ALKPHOS 66  --   BILITOT 1.3*  --   GFRNONAA >60 >60  ANIONGAP 6 7     Lipids  Recent Labs  Lab 08/08/21 0114  CHOL 247*  TRIG 227*  HDL 39*  LDLCALC 163*  CHOLHDL 6.3     Hematology Recent Labs  Lab 08/07/21 0857 08/08/21 0114 08/09/21 0322  WBC 12.6* 9.6 9.7  RBC 5.24 5.00 4.79  HGB 16.7 16.0 15.3  HCT  48.0 45.4 43.7  MCV 91.6 90.8 91.2  MCH 31.9 32.0 31.9  MCHC 34.8 35.2 35.0  RDW 12.1 12.2 12.1  PLT 264 239 222    Thyroid  Recent Labs  Lab 08/08/21 0114  TSH 2.612     BNPNo results for input(s): "BNP", "PROBNP" in the last 168 hours.  DDimer No results for input(s): "DDIMER" in the last 168 hours.   Radiology    ECHOCARDIOGRAM COMPLETE  Result Date: 08/07/2021    ECHOCARDIOGRAM REPORT   Patient Name:   Russell Miller Statler Date of Exam: 08/07/2021 Medical Rec #:  [id number]       Height:       71.0 in Accession #:    [id number]      Weight:       200.0 lb Date of Birth:  06/02/1970      BSA:          2.109 m Patient Age:    50 years        BP:           120/79 mmHg Patient Gender: M               HR:           87 bpm. Exam Location:  Inpatient Procedure: 2D Echo, Cardiac Doppler and Color Doppler Indications:    NSTEMI I21.4  History:        Patient has no prior history of Echocardiogram examinations.                 Cardiomyopathy, Previous Myocardial Infarction and CAD; Risk                 Factors:Dyslipidemia and Current Smoker.  Sonographer:    Lucendia Herrlich  Referring Phys: [id number] Jonita Albee IMPRESSIONS  1. Windows are not well visualized. hypokinesis of the inferolateral/inferior walls. Left ventricular ejection fraction, by estimation, is 50 to 55%. The left ventricle has low normal function. There is mild left ventricular hypertrophy. Left ventricular diastolic parameters are consistent with Grade II diastolic dysfunction (pseudonormalization).  2. Right ventricular systolic function is normal. The right ventricular size is normal. Tricuspid regurgitation signal is inadequate for assessing PA pressure.  3. 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 Mild mitral valve regurgitation.  4. The aortic valve is tricuspid. Aortic valve regurgitation is not visualized.  5. The inferior vena cava is normal in size with greater than 50% respiratory variability, suggesting right atrial pressure of 3 mmHg. Comparison(s): No prior Echocardiogram. FINDINGS  Left Ventricle: Windows are not well visualized. hypokinesis of the inferolateral/inferior walls. Left ventricular ejection fraction, by estimation, is 50 to 55%. The left ventricle has low normal function. The left ventricular internal cavity size was normal in size. There is mild left ventricular hypertrophy. Left ventricular diastolic parameters are consistent with Grade II diastolic dysfunction (pseudonormalization). Right Ventricle: The right ventricular size is normal. Right ventricular systolic function is normal. Tricuspid regurgitation signal is inadequate for assessing PA pressure. Left Atrium: Left atrial size was normal in size. Right Atrium: Right atrial size was normal in size. Pericardium: There is no evidence of pericardial effusion. 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 There is mild thickening of the mitral valve leaflet(s). Mild mitral valve regurgitation. Tricuspid Valve: Tricuspid valve regurgitation is not demonstrated. Aortic Valve: The aortic valve is tricuspid. Aortic valve regurgitation is not  visualized. Pulmonic Valve: Pulmonic valve regurgitation is not visualized. Aorta: The aortic root and ascending aorta are structurally normal, with no  evidence of dilitation. Venous: The inferior vena cava is normal in size with greater than 50% respiratory variability, suggesting right atrial pressure of 3 mmHg. IAS/Shunts: No atrial level shunt detected by color flow Doppler.  LEFT VENTRICLE PLAX 2D LVIDd:         1.95 cm   Diastology LVIDs:         4.25 cm   LV e' medial:    6.74 cm/s LV PW:         4.18 cm   LV E/e' medial:  13.8 LV IVS:        0.90 cm   LV e' lateral:   8.16 cm/s LVOT diam:     2.40 cm   LV E/e' lateral: 11.4 LV SV:         78 LV SV Index:   37 LVOT Area:     4.52 cm  RIGHT VENTRICLE TAPSE (M-mode): 2.1 cm LEFT ATRIUM             Index        RIGHT ATRIUM          Index LA diam:        3.60 cm 1.71 cm/m   RA Area:     9.95 cm LA Vol (A2C):   29.3 ml 13.90 ml/m  RA Volume:   20.60 ml 9.77 ml/m LA Vol (A4C):   45.9 ml 21.77 ml/m LA Biplane Vol: 38.2 ml 18.12 ml/m  AORTIC VALVE LVOT Vmax:   116.00 cm/s LVOT Vmean:  69.700 cm/s LVOT VTI:    0.172 m  AORTA Ao Root diam: 2.90 cm Ao Asc diam:  2.90 cm MITRAL VALVE MV Area (PHT): 6.48 cm    SHUNTS MV Decel Time: 117 msec    Systemic VTI:  0.17 m MV E velocity: 93.00 cm/s  Systemic Diam: 2.40 cm MV A velocity: 88.30 cm/s MV E/A ratio:  1.05 Russell Miller signed by Russell Miller Signature Date/Time: 08/07/2021/4:18:31 PM    Final    CARDIAC CATHETERIZATION  Result Date: 08/07/2021   Prox RCA to Mid RCA lesion is 100% stenosed.   Mid Cx to Dist Cx lesion is 99% stenosed.   2nd Mrg lesion is 99% stenosed.   Ost Cx to Prox Cx lesion is 70% stenosed.   1st Diag lesion is 90% stenosed.   Mid LAD-1 lesion is 90% stenosed.   Mid LAD-2 lesion is 50% stenosed.   There is mild left ventricular systolic dysfunction.   LV end diastolic pressure is normal.   The left ventricular ejection fraction is 45-50% by visual estimate. Severe triple  vessel CAD The LAD is a large caliber vessel that courses to the apex. The mid LAD stented segment has severe restenosis. There is moderate stenosis just beyond the stented segment. The large caliber diagonal branch has severe ostial stenosis. The Circumflex is a large caliber vessel with moderately severe ostial stenosis. The mid stented segment involving the obtuse marginal branch has severe stent restenosis with TIMI-2 flow beyond the stent. The large, dominant RCA is completely occluded in the mid segment. The distal vessel and branches fill from left to right collaterals. Mild LV systolic dysfunction with inferior wall hypokinesis. Normal LVEDP Recommendations: Given severe triple vessel CAD including severe stent restenosis, medical non-compliance and ongoing tobacco abuse, I think bypass surgery is his best option for complete revascularization and long term patency. Russell Miller continue ASA, statin, Resume IV heparin drip 2 hours post sheath pull. Echo pending. Russell Miller  consult CT surgery to discuss CABG.   DG Chest 2 View  Result Date: 08/07/2021 CLINICAL DATA:  Chest pain EXAM: CHEST - 2 VIEW COMPARISON:  Chest radiograph 03/09/2009 FINDINGS: The cardiomediastinal silhouette is normal. There is no focal consolidation or pulmonary edema. There is no pleural effusion or pneumothorax There is no acute osseous abnormality. IMPRESSION: No radiographic evidence of acute cardiopulmonary process. Electronically Signed   By: Russell Miller M.D.   On: 08/07/2021 08:52    Cardiac Studies   LHC    Prox RCA to Mid RCA lesion is 100% stenosed.   Mid Cx to Dist Cx lesion is 99% stenosed.   2nd Mrg lesion is 99% stenosed.   Ost Cx to Prox Cx lesion is 70% stenosed.   1st Diag lesion is 90% stenosed.   Mid LAD-1 lesion is 90% stenosed.   Mid LAD-2 lesion is 50% stenosed.   There is mild left ventricular systolic dysfunction.   LV end diastolic pressure is normal.   The left ventricular ejection fraction is 45-50% by  visual estimate.   TTE   1. Windows are not well visualized. hypokinesis of the  inferolateral/inferior walls. Left ventricular ejection fraction, by  estimation, is 50 to 55%. The left ventricle has low normal function.  There is mild left ventricular hypertrophy. Left  ventricular diastolic parameters are consistent with Grade II diastolic  dysfunction (pseudonormalization).   2. Right ventricular systolic function is normal. The right ventricular  size is normal. Tricuspid regurgitation signal is inadequate for assessing  PA pressure.   3. 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 Mild mitral valve regurgitation.   4. The aortic valve is tricuspid. Aortic valve regurgitation is not  visualized.   5. The inferior vena cava is normal in size with greater than 50%  respiratory variability, suggesting right atrial pressure of 3 mmHg.   Patient Profile     51 y.o. male with a history of alcohol abuse, tobacco abuse, coronary artery disease presented to the hospital with non-STEMI  Assessment & Plan    1.  Non-STEMI: Has significant coronary artery disease in all of his epicardial vessels.  He initially refused CABG and wanted stenting, Eliquis reconsidered and Russell Miller plan for CABG.  Awaiting timing per cardiac surgery.  Russell Miller continue IV heparin until surgery.  2.  Alcohol abuse: CIWA ordered.  Complete cessation encouraged.  3.  Tobacco abuse: Complete cessation encouraged.  4.  Hyperlipidemia: Currently on atorvastatin.  LDL goal less than 70.  Russell Russell Miller increase Russell Miller switch to Crestor 40 mg daily.  For questions or updates, please contact [facility] Please consult [email or web address] for contact info under        Signed, Russell Libman Jorja Loa, MD  08/09/2021, 8:26 AM

## 2021-08-09 NOTE — Progress Notes (Signed)
Pre-CABG study completed.   Please see CV Proc for preliminary results.   Jean Rosenthal, RDMS, RVT and Sherren Kerns, RVS

## 2021-08-09 NOTE — Progress Notes (Signed)
ANTICOAGULATION CONSULT NOTE- Follow Up  Pharmacy Consult for heparin Indication: chest pain/ACS  Heparin Dosing Weight: 90.7 kg  Labs: Recent Labs    08/07/21 0857 08/07/21 0857 08/08/21 0114 08/08/21 0948 08/08/21 1726 08/09/21 0322  HGB 16.7  --  16.0  --   --  15.3  HCT 48.0  --  45.4  --   --  43.7  PLT 264  --  239  --   --  222  HEPARINUNFRC  --    < > 0.11* 0.25* 0.39 0.53  CREATININE 0.86  --  0.78  --   --   --    < > = values in this interval not displayed.     Assessment: 89 yom with PMH CAD, HTN, HLD presenting with CP, elevated troponin. Pharmacy consulted to dose heparin for NSTEMI. Patient is not on anticoagulation PTA.   Underwent cardiac cath finding severe three-vessel CAD- patient to undergo CABG. Heparin level of 0.53 is therapeutic on heparin 1550 units/hr. Hgb and plts WNL. No issued with IV infusion or bleeding reported  Goal of Therapy:  Heparin level 0.3-0.7 units/ml Monitor platelets by anticoagulation protocol: Yes   Plan:  Continue heparin drip 1550 units/hr Monitor daily heparin level and CBC, s/sx bleeding    Arabella Merles, PharmD. [facility] PGY-1  08/09/2021 11:01 AM

## 2021-08-09 NOTE — H&P (View-Only) (Signed)
2 Days Post-Op Procedure(s) (LRB): LEFT HEART CATH AND CORONARY ANGIOGRAPHY (N/A) Subjective: Anxious about surgery, no CP  Objective: Vital signs in last 24 hours: Temp:  [98.3 F (36.8 C)-98.7 F (37.1 C)] 98.3 F (36.8 C) (07/30 0916) Pulse Rate:  [69-89] 89 (07/30 0918) Cardiac Rhythm: Normal sinus rhythm (07/30 0802) Resp:  [15-20] 17 (07/30 0916) BP: (115-149)/(89-98) 120/90 (07/30 0918) SpO2:  [99 %-100 %] 100 % (07/30 0916)  Hemodynamic parameters for last 24 hours:    Intake/Output from previous day: 07/29 0701 - 07/30 0700 In: 716 [P.O.:380; I.V.:336] Out: -  Intake/Output this shift: No intake/output data recorded.  General appearance: alert, cooperative, and no distress Neurologic: intact Heart: regular rate and rhythm Lungs: clear to auscultation bilaterally  Lab Results: Recent Labs    08/08/21 0114 08/09/21 0322  WBC 9.6 9.7  HGB 16.0 15.3  HCT 45.4 43.7  PLT 239 222   BMET:  Recent Labs    08/07/21 0857 08/08/21 0114  NA 136 138  K 4.4 3.9  CL 105 104  CO2 25 27  GLUCOSE 96 111*  BUN 5* 6  CREATININE 0.86 0.78  CALCIUM 9.0 9.1    PT/INR: No results for input(s): "LABPROT", "INR" in the last 72 hours. ABG    Component Value Date/Time   TCO2 21 03/09/2009 0557   CBG (last 3)  No results for input(s): "GLUCAP" in the last 72 hours.  Assessment/Plan: S/P Procedure(s) (LRB): LEFT HEART CATH AND CORONARY ANGIOGRAPHY (N/A) 3 vessel CAD s/p non STEMI Dr. Bartle is available to do CABG tomorrow Mr. Andy agrees to proceed. He is aware of the risks and benefits and all questions have been answered   LOS: 2 days    Hattie Aguinaldo C Arieon Scalzo 08/09/2021   

## 2021-08-09 NOTE — Progress Notes (Signed)
2 Days Post-Op Procedure(s) (LRB): LEFT HEART CATH AND CORONARY ANGIOGRAPHY (N/A) Subjective: Anxious about surgery, no CP  Objective: Vital signs in last 24 hours: Temp:  [98.3 F (36.8 C)-98.7 F (37.1 C)] 98.3 F (36.8 C) (07/30 0916) Pulse Rate:  [69-89] 89 (07/30 0918) Cardiac Rhythm: Normal sinus rhythm (07/30 0802) Resp:  [15-20] 17 (07/30 0916) BP: (115-149)/(89-98) 120/90 (07/30 0918) SpO2:  [99 %-100 %] 100 % (07/30 0916)  Hemodynamic parameters for last 24 hours:    Intake/Output from previous day: 07/29 0701 - 07/30 0700 In: 716 [P.O.:380; I.V.:336] Out: -  Intake/Output this shift: No intake/output data recorded.  General appearance: alert, cooperative, and no distress Neurologic: intact Heart: regular rate and rhythm Lungs: clear to auscultation bilaterally  Lab Results: Recent Labs    08/08/21 0114 08/09/21 0322  WBC 9.6 9.7  HGB 16.0 15.3  HCT 45.4 43.7  PLT 239 222   BMET:  Recent Labs    08/07/21 0857 08/08/21 0114  NA 136 138  K 4.4 3.9  CL 105 104  CO2 25 27  GLUCOSE 96 111*  BUN 5* 6  CREATININE 0.86 0.78  CALCIUM 9.0 9.1    PT/INR: No results for input(s): "LABPROT", "INR" in the last 72 hours. ABG    Component Value Date/Time   TCO2 21 03/09/2009 0557   CBG (last 3)  No results for input(s): "GLUCAP" in the last 72 hours.  Assessment/Plan: S/P Procedure(s) (LRB): LEFT HEART CATH AND CORONARY ANGIOGRAPHY (N/A) 3 vessel CAD s/p non STEMI Dr. [name] is available to do CABG tomorrow Mr. Holston agrees to proceed. He is aware of the risks and benefits and all questions have been answered   LOS: 2 days    Loreli Slot 08/09/2021

## 2021-08-10 ENCOUNTER — Inpatient Hospital Stay (HOSPITAL_COMMUNITY): Payer: Managed Care, Other (non HMO) | Admitting: General Practice

## 2021-08-10 ENCOUNTER — Inpatient Hospital Stay (HOSPITAL_COMMUNITY): Payer: Managed Care, Other (non HMO)

## 2021-08-10 ENCOUNTER — Encounter (HOSPITAL_COMMUNITY): Payer: Self-pay | Admitting: Cardiovascular Disease

## 2021-08-10 ENCOUNTER — Inpatient Hospital Stay (HOSPITAL_COMMUNITY)
Admission: EM | Disposition: A | Discharge status: Home / Self Care | Payer: Self-pay | Source: Home / Self Care | Attending: Surgery

## 2021-08-10 ENCOUNTER — Other Ambulatory Visit: Payer: Self-pay

## 2021-08-10 DIAGNOSIS — I214 Non-ST elevation (NSTEMI) myocardial infarction: Secondary | ICD-10-CM | POA: Diagnosis not present

## 2021-08-10 DIAGNOSIS — I25110 Atherosclerotic heart disease of native coronary artery with unstable angina pectoris: Secondary | ICD-10-CM | POA: Diagnosis not present

## 2021-08-10 DIAGNOSIS — I252 Old myocardial infarction: Secondary | ICD-10-CM | POA: Diagnosis not present

## 2021-08-10 DIAGNOSIS — I2510 Atherosclerotic heart disease of native coronary artery without angina pectoris: Secondary | ICD-10-CM | POA: Diagnosis not present

## 2021-08-10 DIAGNOSIS — Z87891 Personal history of nicotine dependence: Secondary | ICD-10-CM

## 2021-08-10 DIAGNOSIS — Z951 Presence of aortocoronary bypass graft: Secondary | ICD-10-CM

## 2021-08-10 HISTORY — PX: CORONARY ARTERY BYPASS GRAFT: SHX141

## 2021-08-10 HISTORY — PX: TEE WITHOUT CARDIOVERSION: SHX5443

## 2021-08-10 LAB — PROTIME-INR
INR: 1.2 (ref 0.8–1.2)
Prothrombin Time: 15.5 seconds — ABNORMAL HIGH (ref 11.4–15.2)

## 2021-08-10 LAB — POCT I-STAT, CHEM 8
BUN: 6 mg/dL (ref 6–20)
BUN: 6 mg/dL (ref 6–20)
BUN: 6 mg/dL (ref 6–20)
BUN: 5 mg/dL — ABNORMAL LOW (ref 6–20)
BUN: 4 mg/dL — ABNORMAL LOW (ref 6–20)
Calcium, Ion: 1.17 mmol/L (ref 1.15–1.40)
Calcium, Ion: 1.23 mmol/L (ref 1.15–1.40)
Calcium, Ion: 1.12 mmol/L — ABNORMAL LOW (ref 1.15–1.40)
Calcium, Ion: 1.07 mmol/L — ABNORMAL LOW (ref 1.15–1.40)
Calcium, Ion: 1.13 mmol/L — ABNORMAL LOW (ref 1.15–1.40)
Chloride: 103 mmol/L (ref 98–111)
Chloride: 103 mmol/L (ref 98–111)
Chloride: 103 mmol/L (ref 98–111)
Chloride: 102 mmol/L (ref 98–111)
Chloride: 103 mmol/L (ref 98–111)
Creatinine, Ser: 0.7 mg/dL (ref 0.61–1.24)
Creatinine, Ser: 0.6 mg/dL — ABNORMAL LOW (ref 0.61–1.24)
Creatinine, Ser: 0.6 mg/dL — ABNORMAL LOW (ref 0.61–1.24)
Creatinine, Ser: 0.5 mg/dL — ABNORMAL LOW (ref 0.61–1.24)
Creatinine, Ser: 0.6 mg/dL — ABNORMAL LOW (ref 0.61–1.24)
Glucose, Bld: 130 mg/dL — ABNORMAL HIGH (ref 70–99)
Glucose, Bld: 122 mg/dL — ABNORMAL HIGH (ref 70–99)
Glucose, Bld: 121 mg/dL — ABNORMAL HIGH (ref 70–99)
Glucose, Bld: 163 mg/dL — ABNORMAL HIGH (ref 70–99)
Glucose, Bld: 144 mg/dL — ABNORMAL HIGH (ref 70–99)
HCT: 42 % (ref 39.0–52.0)
HCT: 40 % (ref 39.0–52.0)
HCT: 33 % — ABNORMAL LOW (ref 39.0–52.0)
HCT: 30 % — ABNORMAL LOW (ref 39.0–52.0)
HCT: 31 % — ABNORMAL LOW (ref 39.0–52.0)
Hemoglobin: 14.3 g/dL (ref 13.0–17.0)
Hemoglobin: 13.6 g/dL (ref 13.0–17.0)
Hemoglobin: 11.2 g/dL — ABNORMAL LOW (ref 13.0–17.0)
Hemoglobin: 10.2 g/dL — ABNORMAL LOW (ref 13.0–17.0)
Hemoglobin: 10.5 g/dL — ABNORMAL LOW (ref 13.0–17.0)
Potassium: 4 mmol/L (ref 3.5–5.1)
Potassium: 4.5 mmol/L (ref 3.5–5.1)
Potassium: 4.6 mmol/L (ref 3.5–5.1)
Potassium: 4.8 mmol/L (ref 3.5–5.1)
Potassium: 3.9 mmol/L (ref 3.5–5.1)
Sodium: 139 mmol/L (ref 135–145)
Sodium: 138 mmol/L (ref 135–145)
Sodium: 136 mmol/L (ref 135–145)
Sodium: 134 mmol/L — ABNORMAL LOW (ref 135–145)
Sodium: 138 mmol/L (ref 135–145)
TCO2: 26 mmol/L (ref 22–32)
TCO2: 26 mmol/L (ref 22–32)
TCO2: 26 mmol/L (ref 22–32)
TCO2: 21 mmol/L — ABNORMAL LOW (ref 22–32)
TCO2: 24 mmol/L (ref 22–32)

## 2021-08-10 LAB — GLUCOSE, CAPILLARY
Glucose-Capillary: 123 mg/dL — ABNORMAL HIGH (ref 70–99)
Glucose-Capillary: 140 mg/dL — ABNORMAL HIGH (ref 70–99)
Glucose-Capillary: 155 mg/dL — ABNORMAL HIGH (ref 70–99)
Glucose-Capillary: 137 mg/dL — ABNORMAL HIGH (ref 70–99)
Glucose-Capillary: 134 mg/dL — ABNORMAL HIGH (ref 70–99)
Glucose-Capillary: 137 mg/dL — ABNORMAL HIGH (ref 70–99)
Glucose-Capillary: 160 mg/dL — ABNORMAL HIGH (ref 70–99)
Glucose-Capillary: 156 mg/dL — ABNORMAL HIGH (ref 70–99)
Glucose-Capillary: 144 mg/dL — ABNORMAL HIGH (ref 70–99)
Glucose-Capillary: 131 mg/dL — ABNORMAL HIGH (ref 70–99)

## 2021-08-10 LAB — POCT I-STAT 7, (LYTES, BLD GAS, ICA,H+H)
Acid-Base Excess: 0 mmol/L (ref 0.0–2.0)
Acid-Base Excess: 3 mmol/L — ABNORMAL HIGH (ref 0.0–2.0)
Acid-base deficit: 1 mmol/L (ref 0.0–2.0)
Acid-base deficit: 2 mmol/L (ref 0.0–2.0)
Acid-base deficit: 2 mmol/L (ref 0.0–2.0)
Acid-base deficit: 4 mmol/L — ABNORMAL HIGH (ref 0.0–2.0)
Acid-base deficit: 4 mmol/L — ABNORMAL HIGH (ref 0.0–2.0)
Bicarbonate: 26.5 mmol/L (ref 20.0–28.0)
Bicarbonate: 28.6 mmol/L — ABNORMAL HIGH (ref 20.0–28.0)
Bicarbonate: 23.2 mmol/L (ref 20.0–28.0)
Bicarbonate: 24.1 mmol/L (ref 20.0–28.0)
Bicarbonate: 23.5 mmol/L (ref 20.0–28.0)
Bicarbonate: 21.9 mmol/L (ref 20.0–28.0)
Bicarbonate: 21.5 mmol/L (ref 20.0–28.0)
Calcium, Ion: 1.25 mmol/L (ref 1.15–1.40)
Calcium, Ion: 1.05 mmol/L — ABNORMAL LOW (ref 1.15–1.40)
Calcium, Ion: 1.1 mmol/L — ABNORMAL LOW (ref 1.15–1.40)
Calcium, Ion: 1.13 mmol/L — ABNORMAL LOW (ref 1.15–1.40)
Calcium, Ion: 1.16 mmol/L (ref 1.15–1.40)
Calcium, Ion: 1.16 mmol/L (ref 1.15–1.40)
Calcium, Ion: 1.18 mmol/L (ref 1.15–1.40)
HCT: 43 % (ref 39.0–52.0)
HCT: 30 % — ABNORMAL LOW (ref 39.0–52.0)
HCT: 33 % — ABNORMAL LOW (ref 39.0–52.0)
HCT: 34 % — ABNORMAL LOW (ref 39.0–52.0)
HCT: 34 % — ABNORMAL LOW (ref 39.0–52.0)
HCT: 34 % — ABNORMAL LOW (ref 39.0–52.0)
HCT: 34 % — ABNORMAL LOW (ref 39.0–52.0)
Hemoglobin: 14.6 g/dL (ref 13.0–17.0)
Hemoglobin: 10.2 g/dL — ABNORMAL LOW (ref 13.0–17.0)
Hemoglobin: 11.2 g/dL — ABNORMAL LOW (ref 13.0–17.0)
Hemoglobin: 11.6 g/dL — ABNORMAL LOW (ref 13.0–17.0)
Hemoglobin: 11.6 g/dL — ABNORMAL LOW (ref 13.0–17.0)
Hemoglobin: 11.6 g/dL — ABNORMAL LOW (ref 13.0–17.0)
Hemoglobin: 11.6 g/dL — ABNORMAL LOW (ref 13.0–17.0)
O2 Saturation: 100 %
O2 Saturation: 100 %
O2 Saturation: 100 %
O2 Saturation: 99 %
O2 Saturation: 99 %
O2 Saturation: 99 %
O2 Saturation: 98 %
Patient temperature: 36.2
Patient temperature: 36.1
Patient temperature: 36.2
Potassium: 4 mmol/L (ref 3.5–5.1)
Potassium: 4.8 mmol/L (ref 3.5–5.1)
Potassium: 3.8 mmol/L (ref 3.5–5.1)
Potassium: 4.4 mmol/L (ref 3.5–5.1)
Potassium: 4 mmol/L (ref 3.5–5.1)
Potassium: 4.4 mmol/L (ref 3.5–5.1)
Potassium: 4.3 mmol/L (ref 3.5–5.1)
Sodium: 138 mmol/L (ref 135–145)
Sodium: 137 mmol/L (ref 135–145)
Sodium: 136 mmol/L (ref 135–145)
Sodium: 138 mmol/L (ref 135–145)
Sodium: 139 mmol/L (ref 135–145)
Sodium: 139 mmol/L (ref 135–145)
Sodium: 139 mmol/L (ref 135–145)
TCO2: 28 mmol/L (ref 22–32)
TCO2: 30 mmol/L (ref 22–32)
TCO2: 24 mmol/L (ref 22–32)
TCO2: 25 mmol/L (ref 22–32)
TCO2: 25 mmol/L (ref 22–32)
TCO2: 23 mmol/L (ref 22–32)
TCO2: 23 mmol/L (ref 22–32)
pCO2 arterial: 49 mmHg — ABNORMAL HIGH (ref 32–48)
pCO2 arterial: 49 mmHg — ABNORMAL HIGH (ref 32–48)
pCO2 arterial: 39.2 mmHg (ref 32–48)
pCO2 arterial: 41.2 mmHg (ref 32–48)
pCO2 arterial: 42.9 mmHg (ref 32–48)
pCO2 arterial: 38.9 mmHg (ref 32–48)
pCO2 arterial: 40 mmHg (ref 32–48)
pH, Arterial: 7.34 — ABNORMAL LOW (ref 7.35–7.45)
pH, Arterial: 7.375 (ref 7.35–7.45)
pH, Arterial: 7.36 (ref 7.35–7.45)
pH, Arterial: 7.398 (ref 7.35–7.45)
pH, Arterial: 7.343 — ABNORMAL LOW (ref 7.35–7.45)
pH, Arterial: 7.354 (ref 7.35–7.45)
pH, Arterial: 7.334 — ABNORMAL LOW (ref 7.35–7.45)
pO2, Arterial: 289 mmHg — ABNORMAL HIGH (ref 83–108)
pO2, Arterial: 330 mmHg — ABNORMAL HIGH (ref 83–108)
pO2, Arterial: 155 mmHg — ABNORMAL HIGH (ref 83–108)
pO2, Arterial: 257 mmHg — ABNORMAL HIGH (ref 83–108)
pO2, Arterial: 161 mmHg — ABNORMAL HIGH (ref 83–108)
pO2, Arterial: 132 mmHg — ABNORMAL HIGH (ref 83–108)
pO2, Arterial: 99 mmHg (ref 83–108)

## 2021-08-10 LAB — CBC
HCT: 43.2 % (ref 39.0–52.0)
HCT: 35.1 % — ABNORMAL LOW (ref 39.0–52.0)
HCT: 34.9 % — ABNORMAL LOW (ref 39.0–52.0)
Hemoglobin: 15.1 g/dL (ref 13.0–17.0)
Hemoglobin: 12.5 g/dL — ABNORMAL LOW (ref 13.0–17.0)
Hemoglobin: 12.4 g/dL — ABNORMAL LOW (ref 13.0–17.0)
MCH: 32.1 pg (ref 26.0–34.0)
MCH: 32.5 pg (ref 26.0–34.0)
MCH: 32.6 pg (ref 26.0–34.0)
MCHC: 35 g/dL (ref 30.0–36.0)
MCHC: 35.6 g/dL (ref 30.0–36.0)
MCHC: 35.5 g/dL (ref 30.0–36.0)
MCV: 91.9 fL (ref 80.0–100.0)
MCV: 91.2 fL (ref 80.0–100.0)
MCV: 91.8 fL (ref 80.0–100.0)
Platelets: 238 10*3/uL (ref 150–400)
Platelets: 148 10*3/uL — ABNORMAL LOW (ref 150–400)
Platelets: 179 10*3/uL (ref 150–400)
RBC: 4.7 MIL/uL (ref 4.22–5.81)
RBC: 3.85 MIL/uL — ABNORMAL LOW (ref 4.22–5.81)
RBC: 3.8 MIL/uL — ABNORMAL LOW (ref 4.22–5.81)
RDW: 11.9 % (ref 11.5–15.5)
RDW: 11.9 % (ref 11.5–15.5)
RDW: 11.9 % (ref 11.5–15.5)
WBC: 9.4 10*3/uL (ref 4.0–10.5)
WBC: 18.3 10*3/uL — ABNORMAL HIGH (ref 4.0–10.5)
WBC: 20.1 10*3/uL — ABNORMAL HIGH (ref 4.0–10.5)
nRBC: 0 % (ref 0.0–0.2)
nRBC: 0 % (ref 0.0–0.2)
nRBC: 0 % (ref 0.0–0.2)

## 2021-08-10 LAB — BASIC METABOLIC PANEL
Anion gap: 7 (ref 5–15)
Anion gap: 5 (ref 5–15)
BUN: 7 mg/dL (ref 6–20)
BUN: 7 mg/dL (ref 6–20)
CO2: 28 mmol/L (ref 22–32)
CO2: 22 mmol/L (ref 22–32)
Calcium: 9.3 mg/dL (ref 8.9–10.3)
Calcium: 7.7 mg/dL — ABNORMAL LOW (ref 8.9–10.3)
Chloride: 104 mmol/L (ref 98–111)
Chloride: 110 mmol/L (ref 98–111)
Creatinine, Ser: 0.89 mg/dL (ref 0.61–1.24)
Creatinine, Ser: 0.71 mg/dL (ref 0.61–1.24)
GFR, Estimated: >60 mL/min (ref >60)
GFR, Estimated: >60 mL/min (ref >60)
Glucose, Bld: 109 mg/dL — ABNORMAL HIGH (ref 70–99)
Glucose, Bld: 150 mg/dL — ABNORMAL HIGH (ref 70–99)
Potassium: 4.4 mmol/L (ref 3.5–5.1)
Potassium: 4.4 mmol/L (ref 3.5–5.1)
Sodium: 139 mmol/L (ref 135–145)
Sodium: 137 mmol/L (ref 135–145)

## 2021-08-10 LAB — POCT I-STAT EG7
Acid-Base Excess: 0 mmol/L (ref 0.0–2.0)
Bicarbonate: 26.2 mmol/L (ref 20.0–28.0)
Calcium, Ion: 1.09 mmol/L — ABNORMAL LOW (ref 1.15–1.40)
HCT: 32 % — ABNORMAL LOW (ref 39.0–52.0)
Hemoglobin: 10.9 g/dL — ABNORMAL LOW (ref 13.0–17.0)
O2 Saturation: 86 %
Potassium: 5.4 mmol/L — ABNORMAL HIGH (ref 3.5–5.1)
Sodium: 137 mmol/L (ref 135–145)
TCO2: 28 mmol/L (ref 22–32)
pCO2, Ven: 47.6 mmHg (ref 44–60)
pH, Ven: 7.349 (ref 7.25–7.43)
pO2, Ven: 54 mmHg — ABNORMAL HIGH (ref 32–45)

## 2021-08-10 LAB — APTT: aPTT: 30 seconds (ref 24–36)

## 2021-08-10 LAB — HEMOGLOBIN AND HEMATOCRIT, BLOOD
HCT: 30.1 % — ABNORMAL LOW (ref 39.0–52.0)
Hemoglobin: 10.8 g/dL — ABNORMAL LOW (ref 13.0–17.0)

## 2021-08-10 LAB — TYPE AND SCREEN
ABO/RH(D): O POS
Antibody Screen: NEGATIVE

## 2021-08-10 LAB — PLATELET COUNT: Platelets: 164 10*3/uL (ref 150–400)

## 2021-08-10 LAB — MAGNESIUM: Magnesium: 3.3 mg/dL — ABNORMAL HIGH (ref 1.7–2.4)

## 2021-08-10 LAB — HEPARIN LEVEL (UNFRACTIONATED): Heparin Unfractionated: 0.38 IU/mL (ref 0.30–0.70)

## 2021-08-10 LAB — ABO/RH: ABO/RH(D): O POS

## 2021-08-10 SURGERY — CORONARY ARTERY BYPASS GRAFTING (CABG)
Anesthesia: General | Site: Chest

## 2021-08-10 MED ORDER — PROTAMINE SULFATE 10 MG/ML IV SOLN
INTRAVENOUS | Status: AC
  Filled 2021-08-10: qty 10

## 2021-08-10 MED ORDER — ASPIRIN 325 MG PO TBEC
325.0000 mg | DELAYED_RELEASE_TABLET | Freq: Every day | ORAL | Status: DC
  Administered 2021-08-11: 325 mg via ORAL
  Filled 2021-08-10 (×2): qty 1

## 2021-08-10 MED ORDER — METOPROLOL TARTRATE 5 MG/5ML IV SOLN: 2.5000 mg | INTRAVENOUS | Status: DC | PRN

## 2021-08-10 MED ORDER — LACTATED RINGERS IV SOLN: INTRAVENOUS | Status: DC | PRN

## 2021-08-10 MED ORDER — ACETAMINOPHEN 160 MG/5ML PO SOLN
1000.0000 mg | Freq: Four times a day (QID) | ORAL | Status: DC
  Filled 2021-08-10: qty 40.6

## 2021-08-10 MED ORDER — LACTATED RINGERS IV SOLN: 500.0000 mL | Freq: Once | INTRAVENOUS | Status: DC | PRN

## 2021-08-10 MED ORDER — HEMOSTATIC AGENTS (NO CHARGE) OPTIME
TOPICAL | Status: DC | PRN
  Administered 2021-08-10: 1 via TOPICAL

## 2021-08-10 MED ORDER — FENTANYL CITRATE (PF) 250 MCG/5ML IJ SOLN
INTRAMUSCULAR | Status: AC
  Filled 2021-08-10: qty 5

## 2021-08-10 MED ORDER — PROPOFOL 10 MG/ML IV BOLUS
INTRAVENOUS | Status: AC
  Filled 2021-08-10: qty 20

## 2021-08-10 MED ORDER — PHENYLEPHRINE 80 MCG/ML (10ML) SYRINGE FOR IV PUSH (FOR BLOOD PRESSURE SUPPORT)
PREFILLED_SYRINGE | INTRAVENOUS | Status: AC
  Filled 2021-08-10: qty 10

## 2021-08-10 MED ORDER — SODIUM CHLORIDE 0.9 % IV SOLN: INTRAVENOUS | Status: DC

## 2021-08-10 MED ORDER — PROTAMINE SULFATE 10 MG/ML IV SOLN
INTRAVENOUS | Status: AC
  Filled 2021-08-10: qty 5

## 2021-08-10 MED ORDER — METOPROLOL TARTRATE 12.5 MG HALF TABLET
12.5000 mg | ORAL_TABLET | Freq: Two times a day (BID) | ORAL | Status: DC
  Administered 2021-08-11: 12.5 mg via ORAL
  Filled 2021-08-10: qty 1

## 2021-08-10 MED ORDER — CEFAZOLIN SODIUM-DEXTROSE 2-4 GM/100ML-% IV SOLN
2.0000 g | Freq: Three times a day (TID) | INTRAVENOUS | Status: DC
  Filled 2021-08-10: qty 100

## 2021-08-10 MED ORDER — MIDAZOLAM HCL (PF) 5 MG/ML IJ SOLN
INTRAMUSCULAR | Status: DC | PRN
  Administered 2021-08-10 (×5): 2 mg via INTRAVENOUS

## 2021-08-10 MED ORDER — BISACODYL 5 MG PO TBEC
10.0000 mg | DELAYED_RELEASE_TABLET | Freq: Every day | ORAL | Status: DC
  Administered 2021-08-11: 10 mg via ORAL
  Filled 2021-08-10 (×2): qty 2

## 2021-08-10 MED ORDER — DEXTROSE 50 % IV SOLN: 0.0000 mL | INTRAVENOUS | Status: DC | PRN

## 2021-08-10 MED ORDER — PHENYLEPHRINE 80 MCG/ML (10ML) SYRINGE FOR IV PUSH (FOR BLOOD PRESSURE SUPPORT)
PREFILLED_SYRINGE | INTRAVENOUS | Status: DC | PRN
  Administered 2021-08-10 (×3): 80 ug via INTRAVENOUS

## 2021-08-10 MED ORDER — LACTATED RINGERS IV SOLN: INTRAVENOUS | Status: DC

## 2021-08-10 MED ORDER — POTASSIUM CHLORIDE 10 MEQ/50ML IV SOLN: 10.0000 meq | INTRAVENOUS | Status: AC

## 2021-08-10 MED ORDER — CHLORHEXIDINE GLUCONATE 0.12 % MT SOLN
15.0000 mL | Freq: Once | OROMUCOSAL | Status: AC
  Administered 2021-08-10: 15 mL via OROMUCOSAL

## 2021-08-10 MED ORDER — DOCUSATE SODIUM 100 MG PO CAPS
200.0000 mg | ORAL_CAPSULE | Freq: Every day | ORAL | Status: DC
  Administered 2021-08-11: 200 mg via ORAL
  Filled 2021-08-10 (×2): qty 2

## 2021-08-10 MED ORDER — CEFAZOLIN SODIUM-DEXTROSE 2-4 GM/100ML-% IV SOLN
2.0000 g | Freq: Three times a day (TID) | INTRAVENOUS | Status: DC
  Administered 2021-08-10 – 2021-08-12 (×5): 2 g via INTRAVENOUS
  Filled 2021-08-10 (×5): qty 100

## 2021-08-10 MED ORDER — OXYCODONE HCL 5 MG PO TABS
5.0000 mg | ORAL_TABLET | ORAL | Status: DC | PRN
  Administered 2021-08-10: 10 mg via ORAL
  Administered 2021-08-10: 5 mg via ORAL
  Administered 2021-08-11 (×5): 10 mg via ORAL
  Filled 2021-08-10: qty 1
  Filled 2021-08-10 (×6): qty 2

## 2021-08-10 MED ORDER — TRAMADOL HCL 50 MG PO TABS
50.0000 mg | ORAL_TABLET | ORAL | Status: DC | PRN
  Administered 2021-08-10 – 2021-08-11 (×3): 100 mg via ORAL
  Filled 2021-08-10 (×4): qty 2

## 2021-08-10 MED ORDER — ONDANSETRON HCL 4 MG/2ML IJ SOLN
INTRAMUSCULAR | Status: DC | PRN
  Administered 2021-08-10: 4 mg via INTRAVENOUS

## 2021-08-10 MED ORDER — HEPARIN SODIUM (PORCINE) 1000 UNIT/ML IJ SOLN
INTRAMUSCULAR | Status: DC | PRN
  Administered 2021-08-10: 36000 [IU] via INTRAVENOUS

## 2021-08-10 MED ORDER — 0.9 % SODIUM CHLORIDE (POUR BTL) OPTIME
TOPICAL | Status: DC | PRN
  Administered 2021-08-10: 5000 mL

## 2021-08-10 MED ORDER — SODIUM CHLORIDE 0.9 % IV SOLN: 250.0000 mL | INTRAVENOUS | Status: DC

## 2021-08-10 MED ORDER — MIDAZOLAM HCL (PF) 10 MG/2ML IJ SOLN
INTRAMUSCULAR | Status: AC
  Filled 2021-08-10: qty 2

## 2021-08-10 MED ORDER — ONDANSETRON HCL 4 MG/2ML IJ SOLN
4.0000 mg | Freq: Four times a day (QID) | INTRAMUSCULAR | Status: DC | PRN
  Administered 2021-08-10 – 2021-08-11 (×3): 4 mg via INTRAVENOUS
  Filled 2021-08-10 (×4): qty 2

## 2021-08-10 MED ORDER — ROCURONIUM BROMIDE 10 MG/ML (PF) SYRINGE
PREFILLED_SYRINGE | INTRAVENOUS | Status: AC
  Filled 2021-08-10: qty 10

## 2021-08-10 MED ORDER — PANTOPRAZOLE SODIUM 40 MG PO TBEC
40.0000 mg | DELAYED_RELEASE_TABLET | Freq: Every day | ORAL | Status: DC
  Filled 2021-08-10: qty 1

## 2021-08-10 MED ORDER — ACETAMINOPHEN 160 MG/5ML PO SOLN: 650.0000 mg | Freq: Once | ORAL | Status: AC

## 2021-08-10 MED ORDER — SODIUM CHLORIDE 0.9% FLUSH: 3.0000 mL | INTRAVENOUS | Status: DC | PRN

## 2021-08-10 MED ORDER — NITROGLYCERIN IN D5W 200-5 MCG/ML-% IV SOLN: 0.0000 ug/min | INTRAVENOUS | Status: DC

## 2021-08-10 MED ORDER — MIDAZOLAM HCL 2 MG/2ML IJ SOLN: 2.0000 mg | INTRAMUSCULAR | Status: DC | PRN

## 2021-08-10 MED ORDER — BISACODYL 10 MG RE SUPP: 10.0000 mg | Freq: Every day | RECTAL | Status: DC

## 2021-08-10 MED ORDER — SODIUM CHLORIDE 0.45 % IV SOLN: INTRAVENOUS | Status: DC | PRN

## 2021-08-10 MED ORDER — THROMBIN 20000 UNITS EX SOLR
OROMUCOSAL | Status: DC | PRN
  Administered 2021-08-10 (×3): 3 mL via TOPICAL

## 2021-08-10 MED ORDER — ROCURONIUM BROMIDE 10 MG/ML (PF) SYRINGE
PREFILLED_SYRINGE | INTRAVENOUS | Status: AC
  Filled 2021-08-10: qty 20

## 2021-08-10 MED ORDER — LIDOCAINE HCL (CARDIAC) PF 100 MG/5ML IV SOSY
PREFILLED_SYRINGE | INTRAVENOUS | Status: DC | PRN
  Administered 2021-08-10: 60 mg via INTRATRACHEAL

## 2021-08-10 MED ORDER — PROTAMINE SULFATE 10 MG/ML IV SOLN
INTRAVENOUS | Status: AC
  Filled 2021-08-10: qty 25

## 2021-08-10 MED ORDER — PROPOFOL 10 MG/ML IV BOLUS
INTRAVENOUS | Status: DC | PRN
  Administered 2021-08-10: 20 mg via INTRAVENOUS
  Administered 2021-08-10: 140 mg via INTRAVENOUS
  Administered 2021-08-10 (×3): 30 mg via INTRAVENOUS

## 2021-08-10 MED ORDER — ORAL CARE MOUTH RINSE: 15.0000 mL | OROMUCOSAL | Status: DC | PRN

## 2021-08-10 MED ORDER — HEPARIN SODIUM (PORCINE) 1000 UNIT/ML IJ SOLN
INTRAMUSCULAR | Status: AC
  Filled 2021-08-10: qty 10

## 2021-08-10 MED ORDER — SODIUM CHLORIDE 0.9% FLUSH
3.0000 mL | Freq: Two times a day (BID) | INTRAVENOUS | Status: DC
  Administered 2021-08-11 (×2): 3 mL via INTRAVENOUS

## 2021-08-10 MED ORDER — NICOTINE 14 MG/24HR TD PT24
14.0000 mg | MEDICATED_PATCH | Freq: Every day | TRANSDERMAL | Status: DC
  Administered 2021-08-10 – 2021-08-13 (×4): 14 mg via TRANSDERMAL
  Filled 2021-08-10 (×5): qty 1

## 2021-08-10 MED ORDER — FENTANYL CITRATE (PF) 250 MCG/5ML IJ SOLN
INTRAMUSCULAR | Status: DC | PRN
  Administered 2021-08-10 (×5): 100 ug via INTRAVENOUS
  Administered 2021-08-10: 50 ug via INTRAVENOUS
  Administered 2021-08-10: 100 ug via INTRAVENOUS
  Administered 2021-08-10: 150 ug via INTRAVENOUS
  Administered 2021-08-10: 50 ug via INTRAVENOUS
  Administered 2021-08-10: 100 ug via INTRAVENOUS

## 2021-08-10 MED ORDER — MORPHINE SULFATE (PF) 2 MG/ML IV SOLN
1.0000 mg | INTRAVENOUS | Status: DC | PRN
  Administered 2021-08-10 (×2): 2 mg via INTRAVENOUS
  Administered 2021-08-10 – 2021-08-11 (×3): 4 mg via INTRAVENOUS
  Administered 2021-08-11 (×2): 2 mg via INTRAVENOUS
  Administered 2021-08-11: 4 mg via INTRAVENOUS
  Filled 2021-08-10: qty 1
  Filled 2021-08-10: qty 2
  Filled 2021-08-10: qty 1
  Filled 2021-08-10: qty 2
  Filled 2021-08-10: qty 1
  Filled 2021-08-10 (×3): qty 2

## 2021-08-10 MED ORDER — ORAL CARE MOUTH RINSE
15.0000 mL | OROMUCOSAL | Status: DC
Start: 2021-08-10 — End: 2021-08-10
  Administered 2021-08-10 (×4): 15 mL via OROMUCOSAL

## 2021-08-10 MED ORDER — CHLORHEXIDINE GLUCONATE 0.12 % MT SOLN
15.0000 mL | OROMUCOSAL | Status: AC
  Administered 2021-08-10: 15 mL via OROMUCOSAL
  Filled 2021-08-10: qty 15

## 2021-08-10 MED ORDER — LACTATED RINGERS IV SOLN
INTRAVENOUS | Status: DC
Start: 2021-08-10 — End: 2021-08-12

## 2021-08-10 MED ORDER — FAMOTIDINE IN NACL 20-0.9 MG/50ML-% IV SOLN
20.0000 mg | Freq: Two times a day (BID) | INTRAVENOUS | Status: AC
  Administered 2021-08-10 (×2): 20 mg via INTRAVENOUS
  Filled 2021-08-10 (×2): qty 50

## 2021-08-10 MED ORDER — LORAZEPAM 1 MG PO TABS
1.0000 mg | ORAL_TABLET | Freq: Four times a day (QID) | ORAL | Status: DC | PRN
  Administered 2021-08-10 – 2021-08-11 (×3): 1 mg via ORAL
  Filled 2021-08-10 (×4): qty 1

## 2021-08-10 MED ORDER — INSULIN REGULAR(HUMAN) IN NACL 100-0.9 UT/100ML-% IV SOLN: INTRAVENOUS | Status: DC

## 2021-08-10 MED ORDER — ASPIRIN 81 MG PO CHEW: 324.0000 mg | CHEWABLE_TABLET | Freq: Every day | ORAL | Status: DC

## 2021-08-10 MED ORDER — ACETAMINOPHEN 650 MG RE SUPP
650.0000 mg | Freq: Once | RECTAL | Status: AC
  Administered 2021-08-10: 650 mg via RECTAL

## 2021-08-10 MED ORDER — DEXMEDETOMIDINE HCL IN NACL 400 MCG/100ML IV SOLN
0.0000 ug/kg/h | INTRAVENOUS | Status: DC
  Administered 2021-08-10: 0.2 ug/kg/h via INTRAVENOUS
  Filled 2021-08-10: qty 100

## 2021-08-10 MED ORDER — PROTAMINE SULFATE 10 MG/ML IV SOLN
INTRAVENOUS | Status: DC | PRN
  Administered 2021-08-10: 360 mg via INTRAVENOUS

## 2021-08-10 MED ORDER — METOPROLOL TARTRATE 25 MG/10 ML ORAL SUSPENSION: 12.5000 mg | Freq: Two times a day (BID) | ORAL | Status: DC

## 2021-08-10 MED ORDER — VANCOMYCIN HCL IN DEXTROSE 1-5 GM/200ML-% IV SOLN
1000.0000 mg | Freq: Once | INTRAVENOUS | Status: AC
  Administered 2021-08-10: 1000 mg via INTRAVENOUS
  Filled 2021-08-10: qty 200

## 2021-08-10 MED ORDER — THROMBIN 20000 UNITS EX SOLR
CUTANEOUS | Status: DC | PRN
  Administered 2021-08-10: 20000 [IU] via TOPICAL

## 2021-08-10 MED ORDER — MAGNESIUM SULFATE 4 GM/100ML IV SOLN
4.0000 g | Freq: Once | INTRAVENOUS | Status: AC
  Administered 2021-08-10: 4 g via INTRAVENOUS
  Filled 2021-08-10: qty 100

## 2021-08-10 MED ORDER — THROMBIN (RECOMBINANT) 20000 UNITS EX SOLR
CUTANEOUS | Status: AC
  Filled 2021-08-10: qty 20000

## 2021-08-10 MED ORDER — ACETAMINOPHEN 500 MG PO TABS
1000.0000 mg | ORAL_TABLET | Freq: Four times a day (QID) | ORAL | Status: DC
  Administered 2021-08-10 – 2021-08-14 (×12): 1000 mg via ORAL
  Filled 2021-08-10 (×12): qty 2

## 2021-08-10 MED ORDER — ALBUMIN HUMAN 5 % IV SOLN
250.0000 mL | INTRAVENOUS | Status: DC | PRN
  Administered 2021-08-10 (×2): 12.5 g via INTRAVENOUS

## 2021-08-10 MED ORDER — ROCURONIUM BROMIDE 10 MG/ML (PF) SYRINGE
PREFILLED_SYRINGE | INTRAVENOUS | Status: DC | PRN
  Administered 2021-08-10: 100 mg via INTRAVENOUS
  Administered 2021-08-10 (×3): 50 mg via INTRAVENOUS

## 2021-08-10 MED ORDER — HEPARIN SODIUM (PORCINE) 1000 UNIT/ML IJ SOLN
INTRAMUSCULAR | Status: AC
  Filled 2021-08-10: qty 1

## 2021-08-10 MED ORDER — CHLORHEXIDINE GLUCONATE CLOTH 2 % EX PADS
6.0000 | MEDICATED_PAD | Freq: Every day | CUTANEOUS | Status: DC
  Administered 2021-08-10 – 2021-08-11 (×2): 6 via TOPICAL

## 2021-08-10 MED ORDER — PHENYLEPHRINE HCL-NACL 20-0.9 MG/250ML-% IV SOLN: 0.0000 ug/min | INTRAVENOUS | Status: DC

## 2021-08-10 MED ORDER — SODIUM CHLORIDE 0.9 % IV SOLN: INTRAVENOUS | Status: DC | PRN

## 2021-08-10 MED FILL — Lidocaine IV Infusion in D5W Inj 4 MG/ML: INTRAVENOUS | Qty: 500 | Status: AC

## 2021-08-10 SURGICAL SUPPLY — 114 items
ADH SKN CLS APL DERMABOND .7 (GAUZE/BANDAGES/DRESSINGS) ×2
BAG DECANTER FOR FLEXI CONT (MISCELLANEOUS) ×4 IMPLANT
BLADE CLIPPER SURG (BLADE) ×4 IMPLANT
BLADE STERNUM SYSTEM 6 (BLADE) ×4 IMPLANT
BLADE SURG 11 STRL SS (BLADE) ×1 IMPLANT
BNDG ELASTIC 4X5.8 VLCR STR LF (GAUZE/BANDAGES/DRESSINGS) ×4 IMPLANT
BNDG ELASTIC 6X5.8 VLCR STR LF (GAUZE/BANDAGES/DRESSINGS) ×4 IMPLANT
BNDG GAUZE DERMACEA FLUFF (GAUZE/BANDAGES/DRESSINGS) ×1
BNDG GAUZE DERMACEA FLUFF 4 (GAUZE/BANDAGES/DRESSINGS) IMPLANT
BNDG GAUZE ELAST 4 BULKY (GAUZE/BANDAGES/DRESSINGS) ×4 IMPLANT
BNDG GZE DERMACEA 4 6PLY (GAUZE/BANDAGES/DRESSINGS) ×2
CANISTER SUCT 3000ML PPV (MISCELLANEOUS) ×4 IMPLANT
CATH ROBINSON RED A/P 18FR (CATHETERS) ×8 IMPLANT
CATH THORACIC 28FR (CATHETERS) ×4 IMPLANT
CATH THORACIC 36FR (CATHETERS) ×4 IMPLANT
CATH THORACIC 36FR RT ANG (CATHETERS) ×4 IMPLANT
CLIP TI WIDE RED SMALL 24 (CLIP) ×1 IMPLANT
CLIP VESOCCLUDE MED 24/CT (CLIP) IMPLANT
CLIP VESOCCLUDE SM WIDE 24/CT (CLIP) IMPLANT
CONNECTOR STRAIGHT 3/8 (MISCELLANEOUS) ×1 IMPLANT
CONTAINER PROTECT SURGISLUSH (MISCELLANEOUS) ×9 IMPLANT
DERMABOND ADVANCED (GAUZE/BANDAGES/DRESSINGS) ×1
DERMABOND ADVANCED .7 DNX12 (GAUZE/BANDAGES/DRESSINGS) IMPLANT
DRAPE CARDIOVASCULAR INCISE (DRAPES) ×3
DRAPE SRG 135X102X78XABS (DRAPES) ×3 IMPLANT
DRAPE WARM FLUID 44X44 (DRAPES) ×4 IMPLANT
DRSG COVADERM 4X14 (GAUZE/BANDAGES/DRESSINGS) ×4 IMPLANT
ELECT BLADE 4.0 EZ CLEAN MEGAD (MISCELLANEOUS) ×3
ELECT CAUTERY BLADE 6.4 (BLADE) ×4 IMPLANT
ELECT REM PT RETURN 9FT ADLT (ELECTROSURGICAL) ×3
ELECTRODE BLDE 4.0 EZ CLN MEGD (MISCELLANEOUS) IMPLANT
ELECTRODE REM PT RTRN 9FT ADLT (ELECTROSURGICAL) ×6 IMPLANT
FELT TEFLON 1X6 (MISCELLANEOUS) ×7 IMPLANT
GAUZE SPONGE 4X4 12PLY STRL (GAUZE/BANDAGES/DRESSINGS) ×8 IMPLANT
GLOVE BIO SURGEON STRL SZ 6 (GLOVE) ×6 IMPLANT
GLOVE BIO SURGEON STRL SZ 6.5 (GLOVE) ×5 IMPLANT
GLOVE BIO SURGEON STRL SZ7 (GLOVE) IMPLANT
GLOVE BIO SURGEON STRL SZ7.5 (GLOVE) IMPLANT
GLOVE BIOGEL PI IND STRL 6 (GLOVE) IMPLANT
GLOVE BIOGEL PI IND STRL 6.5 (GLOVE) IMPLANT
GLOVE BIOGEL PI IND STRL 7.0 (GLOVE) IMPLANT
GLOVE BIOGEL PI INDICATOR 6 (GLOVE) ×1
GLOVE BIOGEL PI INDICATOR 6.5 (GLOVE) ×1
GLOVE BIOGEL PI INDICATOR 7.0 (GLOVE) ×1
GLOVE ORTHO TXT STRL SZ7.5 (GLOVE) IMPLANT
GLOVE SS BIOGEL STRL SZ 6.5 (GLOVE) IMPLANT
GLOVE SUPERSENSE BIOGEL SZ 6.5 (GLOVE) ×6
GLOVE SURG MICRO LTX SZ7 (GLOVE) ×9 IMPLANT
GOWN STRL REUS W/ TWL LRG LVL3 (GOWN DISPOSABLE) ×12 IMPLANT
GOWN STRL REUS W/ TWL XL LVL3 (GOWN DISPOSABLE) ×3 IMPLANT
GOWN STRL REUS W/TWL LRG LVL3 (GOWN DISPOSABLE) ×24
GOWN STRL REUS W/TWL XL LVL3 (GOWN DISPOSABLE) ×3
HEMOSTAT POWDER SURGIFOAM 1G (HEMOSTASIS) ×12 IMPLANT
HEMOSTAT SURGICEL 2X14 (HEMOSTASIS) ×4 IMPLANT
INSERT FOGARTY 61MM (MISCELLANEOUS) IMPLANT
INSERT FOGARTY XLG (MISCELLANEOUS) IMPLANT
KIT BASIN OR (CUSTOM PROCEDURE TRAY) ×4 IMPLANT
KIT CATH CPB BARTLE (MISCELLANEOUS) ×4 IMPLANT
KIT SUCTION CATH 14FR (SUCTIONS) ×4 IMPLANT
KIT TURNOVER KIT B (KITS) ×4 IMPLANT
KIT VASOVIEW HEMOPRO 2 VH 4000 (KITS) ×4 IMPLANT
NS IRRIG 1000ML POUR BTL (IV SOLUTION) ×20 IMPLANT
PACK E OPEN HEART (SUTURE) ×4 IMPLANT
PACK OPEN HEART (CUSTOM PROCEDURE TRAY) ×4 IMPLANT
PAD ARMBOARD 7.5X6 YLW CONV (MISCELLANEOUS) ×8 IMPLANT
PAD ELECT DEFIB RADIOL ZOLL (MISCELLANEOUS) ×4 IMPLANT
PENCIL BUTTON HOLSTER BLD 10FT (ELECTRODE) ×5 IMPLANT
POSITIONER HEAD DONUT 9IN (MISCELLANEOUS) ×4 IMPLANT
PUNCH AORTIC ROTATE 4.0MM (MISCELLANEOUS) IMPLANT
PUNCH AORTIC ROTATE 4.5MM 8IN (MISCELLANEOUS) ×4 IMPLANT
PUNCH AORTIC ROTATE 5MM 8IN (MISCELLANEOUS) IMPLANT
SEALANT SURG COSEAL 8ML (VASCULAR PRODUCTS) ×1 IMPLANT
SET MPS 3-ND DEL (MISCELLANEOUS) ×1 IMPLANT
SPONGE INTESTINAL PEANUT (DISPOSABLE) IMPLANT
SPONGE T-LAP 4X18 ~~LOC~~+RFID (SPONGE) ×1 IMPLANT
SUPPORT HEART JANKE-BARRON (MISCELLANEOUS) ×4 IMPLANT
SUT BONE WAX W31G (SUTURE) ×4 IMPLANT
SUT MNCRL AB 4-0 PS2 18 (SUTURE) ×1 IMPLANT
SUT PROLENE 3 0 SH DA (SUTURE) IMPLANT
SUT PROLENE 3 0 SH1 36 (SUTURE) ×4 IMPLANT
SUT PROLENE 4 0 RB 1 (SUTURE)
SUT PROLENE 4 0 SH DA (SUTURE) IMPLANT
SUT PROLENE 4-0 RB1 .5 CRCL 36 (SUTURE) IMPLANT
SUT PROLENE 5 0 C 1 36 (SUTURE) IMPLANT
SUT PROLENE 6 0 C 1 30 (SUTURE) ×1 IMPLANT
SUT PROLENE 7 0 BV 1 (SUTURE) IMPLANT
SUT PROLENE 7 0 BV1 MDA (SUTURE) ×5 IMPLANT
SUT PROLENE 8 0 BV175 6 (SUTURE) IMPLANT
SUT SILK  1 MH (SUTURE)
SUT SILK 1 MH (SUTURE) IMPLANT
SUT SILK 2 0 SH (SUTURE) IMPLANT
SUT SILK 2 0 SH CR/8 (SUTURE) ×1 IMPLANT
SUT STEEL STERNAL CCS#1 18IN (SUTURE) IMPLANT
SUT STEEL SZ 6 DBL 3X14 BALL (SUTURE) IMPLANT
SUT VIC AB 1 CTX 36 (SUTURE) ×6
SUT VIC AB 1 CTX36XBRD ANBCTR (SUTURE) ×6 IMPLANT
SUT VIC AB 2-0 CT1 27 (SUTURE) ×3
SUT VIC AB 2-0 CT1 TAPERPNT 27 (SUTURE) IMPLANT
SUT VIC AB 2-0 CTX 27 (SUTURE) IMPLANT
SUT VIC AB 3-0 SH 27 (SUTURE)
SUT VIC AB 3-0 SH 27X BRD (SUTURE) IMPLANT
SUT VIC AB 3-0 X1 27 (SUTURE) IMPLANT
SUT VICRYL 4-0 PS2 18IN ABS (SUTURE) IMPLANT
SYSTEM SAHARA CHEST DRAIN ATS (WOUND CARE) ×4 IMPLANT
TAPE CLOTH SOFT 2X10 (GAUZE/BANDAGES/DRESSINGS) ×1 IMPLANT
TAPE CLOTH SURG 4X10 WHT LF (GAUZE/BANDAGES/DRESSINGS) ×2 IMPLANT
TOWEL GREEN STERILE (TOWEL DISPOSABLE) ×4 IMPLANT
TOWEL GREEN STERILE FF (TOWEL DISPOSABLE) ×4 IMPLANT
TRAY FOLEY SLVR 16FR TEMP STAT (SET/KITS/TRAYS/PACK) ×4 IMPLANT
TUBE CONNECTING 12X1/4 (SUCTIONS) ×1 IMPLANT
TUBING LAP HI FLOW INSUFFLATIO (TUBING) ×4 IMPLANT
UNDERPAD 30X36 HEAVY ABSORB (UNDERPADS AND DIAPERS) ×4 IMPLANT
WATER STERILE IRR 1000ML POUR (IV SOLUTION) ×7 IMPLANT
YANKAUER SUCT BULB TIP NO VENT (SUCTIONS) ×1 IMPLANT

## 2021-08-10 NOTE — Op Note (Signed)
CARDIOVASCULAR SURGERY OPERATIVE NOTE  08/10/2021  Surgeon:  [name], MD  First Assistant: Doree Fudge, [facility] and Aloha Gell PA-C:   An experienced assistant was required given the complexity of this surgery and the standard of surgical care. The assistant was needed for exposure, dissection, suctioning, retraction of delicate tissues and sutures, instrument exchange and for overall help during this procedure.   Preoperative Diagnosis:  Severe multi-vessel coronary artery disease   Postoperative Diagnosis:  Same   Procedure:  Median Sternotomy Extracorporeal circulation 3.   Coronary artery bypass grafting x 5  Left internal mammary artery graft to the LAD Sequential SVG to D1 and D2 SVG to OM SVG to PDA 4.   Endoscopic vein harvest from the right leg   Anesthesia:  General Endotracheal   Clinical History/Surgical Indication:  The patient is a 51 year old gentleman with a history of active smoking, alcohol abuse, HLD and known CAD s/p prior MI and stenting of the LAD and LCX in the past. He now presents with NSTEMI and cath shows severe 3 V CAD with in stent re-stenosis. LVEF by echo was 50-55% with inferolateral and inferior hypokinesis, mild LVH with grade ll diastolic dysfunction. There was mild MR due to tethering of the posterior leaflet. It was felt that CABG was the best treatment for him. I discussed the operative procedure with the patient including alternatives, benefits and risks; including but not limited to bleeding, blood transfusion, infection, stroke, myocardial infarction, graft failure, heart block requiring a permanent pacemaker, organ dysfunction, and death.  Ernest Pine understands and agrees to proceed.    Preparation:  The patient was seen in the preoperative holding area and the correct patient, correct operation were confirmed with the patient  after reviewing the medical record and catheterization. The consent was signed by me. Preoperative antibiotics were given. A pulmonary arterial line and radial arterial line were placed by the anesthesia team. The patient was taken back to the operating room and positioned supine on the operating room table. After being placed under general endotracheal anesthesia by the anesthesia team a foley catheter was placed. The neck, chest, abdomen, and both legs were prepped with betadine soap and solution and draped in the usual sterile manner. A surgical time-out was taken and the correct patient and operative procedure were confirmed with the nursing and anesthesia staff.   Cardiopulmonary Bypass:  A median sternotomy was performed. The pericardium was opened in the midline. Right ventricular function appeared normal. The ascending aorta was of normal size and had no palpable plaque. There were no contraindications to aortic cannulation or cross-clamping. The patient was fully systemically heparinized and the ACT was maintained > 400 sec. The proximal aortic arch was cannulated with a 20 F aortic cannula for arterial inflow. Venous cannulation was performed via the right atrial appendage using a two-staged venous cannula. An antegrade cardioplegia/vent cannula was inserted into the mid-ascending aorta. Aortic occlusion was performed with a single cross-clamp. Systemic cooling to 32 degrees Centigrade and topical cooling of the heart with iced saline were used. Hyperkalemic antegrade cold blood cardioplegia was used to induce diastolic arrest and was then given at about 20 minute intervals throughout the period of arrest to maintain myocardial temperature at or below 10 degrees centigrade. A temperature probe was inserted into the interventricular septum and an insulating pad was placed in the pericardium.   Left internal mammary artery harvest:  The left side of the sternum was retracted using the Rultract  retractor. The left internal mammary artery was harvested as a pedicle graft. All side branches were clipped. It was a medium-sized vessel of good quality with excellent blood flow. It was ligated distally and divided. It was sprayed with topical papaverine solution to prevent vasospasm.   Endoscopic vein harvest:  The right greater saphenous vein was harvested endoscopically through a 2 cm incision medial to the right knee. It was harvested from the upper thigh to below the knee. It was a medium-sized vein of good quality. The side branches were all ligated with 4-0 silk ties.    Coronary arteries:  The coronary arteries were examined.  LAD:  large vessel with no distal disease. The D1 was large, completely intramyocardial but easily located. D2 was medium sized and had proximal disease but no distal disease. LCX: The OM was diffusely diseased but could be grafted distally although the vessel was smaller out there. RCA:  dominant vessel with small PDA and PL branches. The PDA was graftable. There was thinning of the inferoposterior myocardium from prior MI.   Grafts:  LIMA to the LAD: 2.5 mm. It was sewn end to side using 8-0 prolene continuous suture. Sequential SVG to D1:  2.0 mm. It was sewn sequential side to side using 7-0 prolene continuous suture. Sequential SVG to D2:  1.75 mm. It was sewn sequential end to side using 7-0 prolene continuous suture. SVG to OM:  1.6 mm. It was sewn end to side using 7-0 prolene continuous suture. 5.   SVG to PDA:  1.6 mm. It was sewn end to side using 7-0 prolene continuous suture. The proximal vein graft anastomoses were performed to the mid-ascending aorta using continuous 6-0 prolene suture. Graft markers were placed around the proximal anastomoses.   Completion:  The patient was rewarmed to 37 degrees Centigrade. The clamp was removed from the LIMA pedicle and there was rapid warming of the septum and return of ventricular fibrillation. The  crossclamp was removed with a time of 120 minutes. There was spontaneous return of sinus rhythm. The distal and proximal anastomoses were checked for hemostasis. The position of the grafts was satisfactory. Two temporary epicardial pacing wires were placed on the right atrium and two on the right ventricle. The patient was weaned from CPB without difficulty on no inotropes. CPB time was 143 minutes. Cardiac output was 4.5 LPM. TEE showed unchanged mild LV systolic dysfunction, unchanged mild to moderate MR. Heparin was fully reversed with protamine and the aortic and venous cannulas removed. Hemostasis was achieved. Mediastinal and left pleural drainage tubes were placed. The sternum was closed with double #6 stainless steel wires. The fascia was closed with continuous # 1 vicryl suture. The subcutaneous tissue was closed with 2-0 vicryl continuous suture. The skin was closed with 3-0 vicryl subcuticular suture. All sponge, needle, and instrument counts were reported correct at the end of the case. Dry sterile dressings were placed over the incisions and around the chest tubes which were connected to pleurevac suction. The patient was then transported to the surgical intensive care unit in stable condition.

## 2021-08-10 NOTE — Anesthesia Procedure Notes (Signed)
Arterial Line Insertion Start/End7/31/2023 7:00 AM, 08/10/2021 7:10 AM Performed by: Maxine Glenn, CRNA, CRNA  Patient location: Pre-op. Preanesthetic checklist: patient identified, IV checked, site marked, risks and benefits discussed, surgical consent, monitors and equipment checked, pre-op evaluation, timeout performed and anesthesia consent Lidocaine 1% used for infiltration Right, radial was placed Catheter size: 20 G Hand hygiene performed  and maximum sterile barriers used   Attempts: 1 Procedure performed without using ultrasound guided technique. Following insertion, dressing applied and Biopatch. Post procedure assessment: normal and unchanged  Patient tolerated the procedure well with no immediate complications.

## 2021-08-10 NOTE — Brief Op Note (Addendum)
08/07/2021 - 08/10/2021  12:03 PM  PATIENT:  Russell Miller  51 y.o. male  PRE-OPERATIVE DIAGNOSIS:  1. S/p NSTEMI 2.Coronary artery disease  POST-OPERATIVE DIAGNOSIS:  1. S/p NSTEMI 2. Coronary artery disease  PROCEDURE: TRANSESOPHAGEAL ECHOCARDIOGRAM (TEE), CORONARY ARTERY BYPASS GRAFTING (CABG) TIMES 5 (LIMA to LAD, SVG SEQUENTIALLY to DIAGONAL 1 and DIAGONAL 2, SVG to OM, SVG to PDA) USING LEFT INTERNAL MAMMARY ARTERY AND RIGHT GREATER SAPHENOUS VEIN HARVESTED ENDOSCOPICALLY   Vein harvest time: 48 min Vein prep time: 14 min  SURGEON:  Surgeon(s) and Role:    [name], MD - Primary  PHYSICIAN ASSISTANT: 1. Aloha Gell PA-C 2. Damary Doland PA-C  ANESTHESIA:   general  EBL:  Per anesthesia and perfusion record  DRAINS:  Chest tubes placed in the mediastinal and pleural spaces    COUNTS CORRECT:  YES  DICTATION: .Dragon Dictation  PLAN OF CARE: Admit to inpatient   PATIENT DISPOSITION:  ICU - intubated and hemodynamically stable.   Delay start of Pharmacological VTE agent (>24hrs) due to surgical blood loss or risk of bleeding: no  BASELINE WEIGHT: 90.7 kg

## 2021-08-10 NOTE — Transfer of Care (Signed)
Immediate Anesthesia Transfer of Care Note  Patient: Russell Miller  Procedure(s) Performed: CORONARY ARTERY BYPASS GRAFTING (CABG) TIMES 5, USING LEFT INTERNAL MAMMARY ARTERY AND RIGHT GREATER SAPHENOUS VEIN HARVESTED ENDOSCOPICALLY (Chest) TRANSESOPHAGEAL ECHOCARDIOGRAM (TEE)  Patient Location: ICU  Anesthesia Type:General  Level of Consciousness: sedated and Patient remains intubated per anesthesia plan  Airway & Oxygen Therapy: Patient remains intubated per anesthesia plan and Patient placed on Ventilator (see vital sign flow sheet for setting)  Post-op Assessment: Report given to RN and Post -op Vital signs reviewed and stable  Post vital signs: Reviewed and stable  Last Vitals:  Vitals Value Taken Time  BP    Temp 36.3 C 08/10/21 1422  Pulse 73 08/10/21 1422  Resp 0 08/10/21 1422  SpO2 100 % 08/10/21 1422  Vitals shown include unvalidated device data.  Last Pain:  Vitals:   08/10/21 0556  TempSrc: Oral  PainSc: 0-No pain         Complications: No notable events documented.

## 2021-08-10 NOTE — Hospital Course (Addendum)
HPI: Russell Miller is a 51 yo obese male with history of nicotine use, alcohol abuse, HLD, and known CAD.  He had a previous stent placed to his LAD back in 2014.  The patient has not followed up since that time and is not taking medications prior to admission.  He presented to the ED this morning with complaints of CP that began several days ago.  He stated the pain radiated into his back.  He was also experiencing tingling in both of his arms.  The patient was exacerbated with activity, even just walking across the room.  He denied nausea, vomiting, near syncopal symptoms.  He took old NTG that was expired without relief.  Workup in the ED included EKG which showed NSR with no ST changes.  Initial troponin level was 1800.  CXR obtained was normal.  He was started on IV heparin and Cardiology consult was obtained.  On evaluation he was chest pain free.  He was ruled in for NSTEMI and admitted to the hospital for further care.  He underwent cardiac catheterization this afternoon which showed  severe 3 vessel CAD with in stent re-stenosis.  It was felt his best long term outcome would be coronary bypass grafting surgery and Cardiothoracic surgery consultation was requested.  The patient is currently chest pain free.  He continues to smoke 2 ppd per day.  He is heavy a drinker of at least 12-24 beers per day.  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  He is lost to follow up since 2014 and is non-compliant having stopped his medications in 2015.  He has postive family history of CAD.  He does not want to have surgery.  He states that is why he delayed coming to the hospital. Dr. Dorris Fetch discussed the need for coronary artery bypass grafting surgery. Potential risks, benefits, and complications of the surgery were discussed with the patient and he agreed to proceed with surgery. Pre operative carotid Duplex US showed no significant internal carotid artery stenosis bilaterally.  Hospital  Course: Patient underwent a CABG x 5. He was transported from the OR to [facility] ICU in stable condition. He was extubated the evening of surgery. Theone Murdoch, a line, chest tubes, and foley were removed early in his post operative course. He was volume overloaded and diuresed accordingly. He was transitioned off the Insulin drip. His pre op HGA1C was 5.8. He likely has pre diabetes. Nutrition information will be provided with paperwork. Once epicardial pacing wires were removed, ec asa was decreased to 81 mg daily and Plavix 75 mg daily was started. He was started on Lopressor 12.5mg  BID. Patient's foley catheter and central line were removed 08/12/2021. He continued to progress and was felt stable enough to be transferred to the progressive care unit on 08/12/2021. Patient continued to stay in normal sinus rhythm so epicardial pacing wires were removed 08/13/2021 without complication. Due to NSTEMI history Plavix was started and ASA decreased to 81mg  08/14/2021. The patient had some constipation and discomfort which resolved after a bowel movement on 08/14/2021. The patient became tachycardic and an EKG was obtained, sinus tachycardia was confirmed the morning of 08/14/2021. It was felt that the patient could tolerate a titration of his Lopressor so it was increased to 25mg  BID. Patient was observed for a few hours and rhythm remained stable so it was felt the patient could be discharged home.

## 2021-08-10 NOTE — Procedures (Signed)
Extubation Procedure Note  Patient Details:   Name: Russell Miller DOB: Sep 09, 1970 MRN: [id number]   Airway Documentation:    Vent end date: 08/10/21 Vent end time: 1700   Evaluation  O2 sats: stable throughout Complications: No apparent complications Patient did tolerate procedure well. Bilateral Breath Sounds: Clear   Yes  Positive cuff leak noted. NIF > -40, VC 1.26L. Patient placed on [place] 4L with humidity, no stridoor noted. Patient able to reach 625 mL using the incentive spirometer.  Rayburn Felt 08/10/2021, 5:14 PM

## 2021-08-10 NOTE — Anesthesia Preprocedure Evaluation (Signed)
Anesthesia Evaluation  Patient identified by MRN, date of birth, ID band Patient awake    Reviewed: Allergy & Precautions, NPO status , Patient's Chart, lab work & pertinent test results  History of Anesthesia Complications Negative for: history of anesthetic complications  Airway Mallampati: II  TM Distance: >3 FB Neck ROM: Full    Dental  (+) Missing, Dental Advisory Given   Pulmonary neg shortness of breath, neg sleep apnea, neg COPD, neg recent URI, former smoker,    breath sounds clear to auscultation       Cardiovascular + CAD, + Past MI and + Cardiac Stents   Rhythm:Regular  1. Windows are not well visualized. hypokinesis of the  inferolateral/inferior walls. Left ventricular ejection fraction, by  estimation, is 50 to 55%. The left ventricle has low normal function.  There is mild left ventricular hypertrophy. Left  ventricular diastolic parameters are consistent with Grade II diastolic  dysfunction (pseudonormalization).  2. Right ventricular systolic function is normal. The right ventricular  size is normal. Tricuspid regurgitation signal is inadequate for assessing  PA pressure.  3. 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 Mild mitral valve regurgitation.  4. The aortic valve is tricuspid. Aortic valve regurgitation is not  visualized.  5. The inferior vena cava is normal in size with greater than 50%  respiratory variability, suggesting right atrial pressure of 3 mmHg    .  Prox RCA to Mid RCA lesion is 100% stenosed. .  Mid Cx to Dist Cx lesion is 99% stenosed. .  2nd Mrg lesion is 99% stenosed. Suezanne Jacquet Cx to Prox Cx lesion is 70% stenosed. .  1st Diag lesion is 90% stenosed. .  Mid LAD-1 lesion is 90% stenosed. .  Mid LAD-2 lesion is 50% stenosed. .  There is mild left ventricular systolic dysfunction. .  LV end diastolic pressure is normal. .  The left ventricular ejection fraction is 45-50% by visual  estimate.  Severe triple vessel CAD The LAD is a large caliber vessel that courses to the apex. The mid LAD stented segment has severe restenosis. There is moderate stenosis just beyond the stented segment. The large caliber diagonal branch has severe ostial stenosis.  The Circumflex is a large caliber vessel with moderately severe ostial stenosis. The mid stented segment involving the obtuse marginal branch has severe stent restenosis with TIMI-2 flow beyond the stent.  The large, dominant RCA is completely occluded in the mid segment. The distal vessel and branches fill from left to right collaterals.  Mild LV systolic dysfunction with inferior wall hypokinesis.  Normal LVEDP  Recommendations: Given severe triple vessel CAD including severe stent restenosis, medical non-compliance and ongoing tobacco abuse, I think bypass surgery is his best option for complete revascularization and long term patency. Will continue ASA, statin, Resume IV heparin drip 2 hours post sheath pull. Echo pending. Will consult CT surgery to discuss CABG.     Neuro/Psych negative neurological ROS  negative psych ROS   GI/Hepatic negative GI ROS, Neg liver ROS,   Endo/Other    Renal/GU negative Renal ROS     Musculoskeletal negative musculoskeletal ROS (+)   Abdominal   Peds  Hematology negative hematology ROS (+) Lab Results      Component                Value               Date  WBC                      9.4                 08/10/2021                HGB                      14.6                08/10/2021                HCT                      43.0                08/10/2021                MCV                      91.9                08/10/2021                PLT                      238                 08/10/2021              Anesthesia Other Findings   Reproductive/Obstetrics                             Anesthesia Physical Anesthesia Plan  ASA:  4  Anesthesia Plan: General   Post-op Pain Management:    Induction: Intravenous  PONV Risk Score and Plan: Ondansetron and Dexamethasone  Airway Management Planned: Oral ETT  Additional Equipment: Arterial line, CVP, PA Cath, TEE and Ultrasound Guidance Line Placement  Intra-op Plan:   Post-operative Plan: Post-operative intubation/ventilation  Informed Consent: I have reviewed the patients History and Physical, chart, labs and discussed the procedure including the risks, benefits and alternatives for the proposed anesthesia with the patient or authorized representative who has indicated his/her understanding and acceptance.     Dental advisory given  Plan Discussed with: CRNA  Anesthesia Plan Comments:         Anesthesia Quick Evaluation

## 2021-08-10 NOTE — Anesthesia Procedure Notes (Signed)
Procedure Name: Intubation Date/Time: 08/10/2021 8:02 AM  Performed by: Dorann Lodge, CRNAPre-anesthesia Checklist: Patient identified, Emergency Drugs available, Suction available and Patient being monitored Patient Re-evaluated:Patient Re-evaluated prior to induction Oxygen Delivery Method: Circle System Utilized Preoxygenation: Pre-oxygenation with 100% oxygen Induction Type: IV induction Ventilation: Mask ventilation without difficulty Laryngoscope Size: Mac and 4 Grade View: Grade I Tube type: Oral Tube size: 8.0 mm Number of attempts: 1 Airway Equipment and Method: Stylet Placement Confirmation: ETT inserted through vocal cords under direct vision, positive ETCO2 and breath sounds checked- equal and bilateral Secured at: 23 cm Tube secured with: Tape Dental Injury: Teeth and Oropharynx as per pre-operative assessment

## 2021-08-10 NOTE — Progress Notes (Signed)
Patient ID: Russell Miller, male   DOB: 04/22/1970, 51 y.o.   MRN: [id number]  TCTS Evening Rounds:   Hemodynamically stable  CI = 2.9  Extubated and alert  Urine output good  CT output low  CBC    Component Value Date/Time   WBC 20.1 (H) 08/10/2021 1909   RBC 3.80 (L) 08/10/2021 1909   HGB 12.4 (L) 08/10/2021 1909   HCT 34.9 (L) 08/10/2021 1909   PLT 179 08/10/2021 1909   MCV 91.8 08/10/2021 1909   MCV 91.6 03/11/2014 1833   MCH 32.6 08/10/2021 1909   MCHC 35.5 08/10/2021 1909   RDW 11.9 08/10/2021 1909   LYMPHSABS 2.5 08/07/2021 0857   MONOABS 1.0 08/07/2021 0857   EOSABS 0.1 08/07/2021 0857   BASOSABS 0.1 08/07/2021 0857     BMET    Component Value Date/Time   NA 139 08/10/2021 1731   K 4.3 08/10/2021 1731   CL 103 08/10/2021 1303   CO2 28 08/10/2021 0401   GLUCOSE 144 (H) 08/10/2021 1303   BUN 4 (L) 08/10/2021 1303   CREATININE 0.60 (L) 08/10/2021 1303   CALCIUM 9.3 08/10/2021 0401   GFRNONAA >60 08/10/2021 0401     A/P:  Stable postop course. Continue current plans

## 2021-08-10 NOTE — Discharge Summary (Signed)
Physician Discharge Summary       [address]       [place] [id number]             [phone]    Patient ID: Russell Miller MRN: [id number] DOB/AGE: 1970-09-09 51 y.o.  Admit date: 08/07/2021 Discharge date: 08/14/2021  Admission Diagnoses: NSTEMI (non-ST elevated myocardial infarction) (HCC) 2. Coronary artery disease  Discharge Diagnoses:  S/P CABG x 5  3. History of the following: CAD (coronary artery disease) 2011     LCx infarction treated with DES and staged PCI of severe prox LAD stenosis treated with  DES.   Hyperglycemia     Hyperlipidemia     Ischemic cardiomyopathy      mild, with LVEF greater than 45%   Tobacco abuse   Alcohol abuse    Consults: None  Procedure (s): Surgery  CARDIOVASCULAR SURGERY OPERATIVE NOTE   08/10/2021   Surgeon:  Alleen BorneBryan K. Bartle, MD   First Assistant: Doree Fudgeonielle Zimmerman, [facility] and Aloha GellBailey Loraina Stauffer PA-C:   An experienced assistant was required given the complexity of this surgery and the standard of surgical care. The assistant was needed for exposure, dissection, suctioning, retraction of delicate tissues and sutures, instrument exchange and for overall help during this procedure.     Preoperative Diagnosis:  Severe multi-vessel coronary artery disease     Postoperative Diagnosis:  Same     Procedure:   Median Sternotomy Extracorporeal circulation 3.   Coronary artery bypass grafting x 5   Left internal mammary artery graft to the LAD Sequential SVG to D1 and D2 SVG to OM SVG to PDA 4.   Endoscopic vein harvest from the right leg     HPI: Russell Miller is a 51 yo obese male with history of nicotine use, alcohol abuse, HLD, and known CAD.  He had a previous stent placed to his LAD back in 2014.  The patient has not followed up since that time and is not taking medications prior to admission.  He presented to the ED this morning with complaints of CP that began several days ago.  He stated the pain  radiated into his back.  He was also experiencing tingling in both of his arms.  The patient was exacerbated with activity, even just walking across the room.  He denied nausea, vomiting, near syncopal symptoms.  He took old NTG that was expired without relief.  Workup in the ED included EKG which showed NSR with no ST changes.  Initial troponin level was 1800.  CXR obtained was normal.  He was started on IV heparin and Cardiology consult was obtained.  On evaluation he was chest pain free.  He was ruled in for NSTEMI and admitted to the hospital for further care.  He underwent cardiac catheterization this afternoon which showed  severe 3 vessel CAD with in stent re-stenosis.  It was felt his best long term outcome would be coronary bypass grafting surgery and Cardiothoracic surgery consultation was requested.  The patient is currently chest pain free.  He continues to smoke 2 ppd per day.  He is heavy a drinker of at least 12-24 beers per day.  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  He is lost to follow up since 2014 and is non-compliant having stopped his medications in 2015.  He has postive family history of CAD.  He does not want to have surgery.  He states that is why he delayed coming to the  hospital. Dr. Dorris Fetch discussed the need for coronary artery bypass grafting surgery. Potential risks, benefits, and complications of the surgery were discussed with the patient and he agreed to proceed with surgery. Pre operative carotid Duplex US showed no significant internal carotid artery stenosis bilaterally.  Hospital Course: Patient underwent a CABG x 5. He was transported from the OR to [facility] ICU in stable condition. He was extubated the evening of surgery. Theone Murdoch, a line, chest tubes, and foley were removed early in his post operative course. He was volume overloaded and diuresed accordingly. He was transitioned off the Insulin drip. His pre op HGA1C was 5.8. He likely has pre diabetes.  Nutrition information will be provided with paperwork. Once epicardial pacing wires were removed, ec asa was decreased to 81 mg daily and Plavix 75 mg daily was started. He was started on Lopressor 12.5mg  BID. Patient's foley catheter and central line were removed 08/12/2021. He continued to progress and was felt stable enough to be transferred to the progressive care unit on 08/12/2021. Patient continued to stay in normal sinus rhythm so epicardial pacing wires were removed 08/13/2021 without complication. Due to NSTEMI history Plavix was started and ASA decreased to 81mg  08/14/2021. The patient had some constipation and discomfort which resolved after a bowel movement on 08/14/2021. The patient became tachycardic and an EKG was obtained, sinus tachycardia was confirmed the morning of 08/14/2021. It was felt that the patient could tolerate a titration of his Lopressor so it was increased to 25mg  BID. Patient was observed for a few hours and rhythm remained stable so it was felt the patient could be discharged home.    Latest Vital Signs: Blood pressure 118/78, pulse 96, temperature 98.6 F (37 C), temperature source Oral, resp. rate 20, height 5\' 11"  (1.803 m), weight 79.7 kg, SpO2 96 %.  Physical Exam: General appearance: alert, cooperative, and no distress Heart: Normal sinus rhythm to sinus tachycardia, no murmur Lungs: clear to auscultation bilaterally Abdomen: soft, non-tender; bowel sounds normal; no masses,  no organomegaly Extremities: extremities normal, atraumatic, no cyanosis or edema Wound: Minimal clear drainage from RLE wounds, no erythema or purulent fluid      Discharge Condition:Stable and discharged to home.  Recent laboratory studies:  Lab Results  Component Value Date   WBC 12.2 (H) 08/13/2021   HGB 10.6 (L) 08/13/2021   HCT 30.7 (L) 08/13/2021   MCV 93.3 08/13/2021   PLT 172 08/13/2021   Lab Results  Component Value Date   NA 134 (L) 08/13/2021   K 3.7 08/13/2021    CL 100 08/13/2021   CO2 29 08/13/2021   CREATININE 0.80 08/13/2021   GLUCOSE 98 08/13/2021      Diagnostic Studies: DG Chest 2 View  Result Date: 08/13/2021 CLINICAL DATA:  History of CABG EXAM: CHEST - 2 VIEW COMPARISON:  Chest x-ray dated 08/12/2021. FINDINGS: Heart size and mediastinal contours are stable. Median sternotomy wires appear intact and stable in alignment. RIGHT IJ Cordis has been removed. Mild plate-like atelectasis at the LEFT lung base. Probable small LEFT pleural effusion. Lungs otherwise clear. No pneumothorax is seen. IMPRESSION: 1. Mild plate-like atelectasis at the LEFT lung base. Probable small LEFT pleural effusion. Lungs otherwise clear. 2. RIGHT IJ Cordis has been removed.  No pneumothorax. Electronically Signed   By: 10/13/2021 M.D.   On: 08/13/2021 08:24   DG Chest Port 1 View  Result Date: 08/12/2021 CLINICAL DATA:  Bypass surgery.  Chest tube removal. EXAM: PORTABLE CHEST 1  VIEW COMPARISON:  Chest x-ray 08/11/2021 FINDINGS: The right IJ Swan-Ganz catheter has been removed. The IJ Cordis is still in place. The left basilar chest tube and mediastinal drain tubes have been removed. Low lung volumes with streaky bibasilar atelectasis and persistent small left pleural effusion. No edema or pneumothorax. IMPRESSION: 1. Removal of chest tube, mediastinal drain tubes and right IJ Swan-Ganz catheter. 2. Low lung volumes with streaky bibasilar atelectasis and persistent small left pleural effusion. Electronically Signed   By: Rudie Meyer M.D.   On: 08/12/2021 08:32   VAS US DOPPLER PRE CABG  Result Date: 08/11/2021 PREOPERATIVE VASCULAR EVALUATION Patient Name:  Russell Miller  Date of Exam:   08/09/2021 Medical Rec #: [id number]        Accession #:    [id number] Date of Birth: September 03, 1970       Patient Gender: M Patient Age:   62 years Exam Location:  [facility] Procedure:      VAS US DOPPLER PRE CABG Referring Phys: Viviann Spare HENDRICKSON  --------------------------------------------------------------------------------  Indications:     Pre-CABG. Risk Factors:    Hyperlipidemia, current smoker, prior MI, coronary artery                  disease. Other Factors:   History of MI with stent placement. Comparison       No prior studies. Study: Performing Technologist: Sherren Kerns RVS Supporting Technologist: Jean Rosenthal RDMS, RVT  Examination Guidelines: A complete evaluation includes B-mode imaging, spectral Doppler, color Doppler, and power Doppler as needed of all accessible portions of each vessel. Bilateral testing is considered an integral part of a complete examination. Limited examinations for reoccurring indications may be performed as noted.  Right Carotid Findings: +----------+--------+--------+--------+---------------------+------------------+           PSV cm/sEDV cm/sStenosisDescribe             Comments           +----------+--------+--------+--------+---------------------+------------------+ CCA Prox  112     22                                                      +----------+--------+--------+--------+---------------------+------------------+ CCA Distal93      26                                   intimal thickening +----------+--------+--------+--------+---------------------+------------------+ ICA Prox  68      27              focal and hyperechoic                   +----------+--------+--------+--------+---------------------+------------------+ ICA Mid   86      35                                                      +----------+--------+--------+--------+---------------------+------------------+ ICA Distal88      38                                                      +----------+--------+--------+--------+---------------------+------------------+  ECA       81      17                                                       +----------+--------+--------+--------+---------------------+------------------+ +----------+--------+-------+---------+------------+           PSV cm/sEDV cmsDescribe Arm Pressure +----------+--------+-------+---------+------------+ Subclavian107            Turbulent             +----------+--------+-------+---------+------------+ +---------+--------+--------+---------------+ VertebralPSV cm/sEDV cm/sBi- directional +---------+--------+--------+---------------+ Left Carotid Findings: +----------+--------+--------+--------+--------+--------+           PSV cm/sEDV cm/sStenosisDescribeComments +----------+--------+--------+--------+--------+--------+ CCA Prox  101     29                               +----------+--------+--------+--------+--------+--------+ CCA Distal82      24                               +----------+--------+--------+--------+--------+--------+ ICA Prox  80      37                               +----------+--------+--------+--------+--------+--------+ ICA Mid   96      41                               +----------+--------+--------+--------+--------+--------+ ICA Distal85      36                               +----------+--------+--------+--------+--------+--------+ ECA       75      12                               +----------+--------+--------+--------+--------+--------+ +----------+--------+--------+----------------+------------+ SubclavianPSV cm/sEDV cm/sDescribe        Arm Pressure +----------+--------+--------+----------------+------------+           156             Multiphasic, WNL             +----------+--------+--------+----------------+------------+ +---------+--------+--+--------+--+---------+ VertebralPSV cm/s38EDV cm/s16Antegrade +---------+--------+--+--------+--+---------+  ABI Findings: +--------+------------------+-----+---------+--------+ Right   Rt Pressure (mmHg)IndexWaveform Comment   +--------+------------------+-----+---------+--------+ [id number]                    triphasic         +--------+------------------+-----+---------+--------+ PTA                            triphasic         +--------+------------------+-----+---------+--------+ DP                             triphasic         +--------+------------------+-----+---------+--------+ +--------+------------------+-----+---------+-------+ Left    Lt Pressure (mmHg)IndexWaveform Comment +--------+------------------+-----+---------+-------+ [id number]                    triphasic        +--------+------------------+-----+---------+-------+ PTA  triphasic        +--------+------------------+-----+---------+-------+ DP                             triphasic        +--------+------------------+-----+---------+-------+  Right Doppler Findings: +--------+--------+-----+---------+--------+ Site    PressureIndexDoppler  Comments +--------+--------+-----+---------+--------+ [id number]          triphasic         +--------+--------+-----+---------+--------+ Radial               triphasic         +--------+--------+-----+---------+--------+ Ulnar                triphasic         +--------+--------+-----+---------+--------+  Left Doppler Findings: +--------+--------+-----+---------+--------+ Site    PressureIndexDoppler  Comments +--------+--------+-----+---------+--------+ [id number]          triphasic         +--------+--------+-----+---------+--------+ Radial               triphasic         +--------+--------+-----+---------+--------+ Ulnar                triphasic         +--------+--------+-----+---------+--------+  Summary: Right Carotid: The extracranial vessels were near-normal with only minimal wall                thickening or plaque. Left Carotid: The extracranial vessels were near-normal with only minimal wall                thickening or plaque. Vertebrals:  Left vertebral artery demonstrates antegrade flow. Right vertebral              artery demonstrates bidirectional flow. Subclavians: Normal flow hemodynamics were seen in bilateral subclavian              arteries. Right Upper Extremity: Doppler waveforms decrease >50% with right radial compression. Doppler waveforms remain within normal limits with right ulnar compression. Left Upper Extremity: Doppler waveform obliterate with left radial compression. Doppler waveforms decrease <50% with left ulnar compression.  Electronically signed by Gerarda Fraction on 08/11/2021 at 12:41:02 PM.    Final    DG Chest Port 1 View  Result Date: 08/11/2021 CLINICAL DATA:  Post open heart surgery EXAM: PORTABLE CHEST 1 VIEW COMPARISON:  Portable exam 0510 hours compared to 08/10/2021 FINDINGS: Interval removal of endotracheal and nasogastric tubes. RIGHT jugular Swan-Ganz catheter with tip projecting over bifurcation of main pulmonary artery. Mediastinal drains and LEFT thoracostomy tube present. Epicardial pacing wires present. Upper normal heart size post CABG. Slight prominent mediastinal contours likely related to surgery. Decreased lung volumes with bibasilar atelectasis. No infiltrate, pleural effusion, or pneumothorax. IMPRESSION: Decreased lung volumes with bibasilar atelectasis. Line and tube positions as above. Electronically Signed   By: Ulyses Southward M.D.   On: 08/11/2021 08:21   DG Chest Port 1 View  Result Date: 08/10/2021 CLINICAL DATA:  Status post CABG. EXAM: PORTABLE CHEST 1 VIEW COMPARISON:  07/30/2021 FINDINGS: Interval median sternotomy for CABG. Right-sided IJ Swan-Ganz catheter tip pulmonary outflow tract. Endotracheal tube is difficult to visualize centrally, likely terminating at the level of the clavicles, 7.4 cm above the carina. Nasogastric tube tip at gastric body with side port well below the gastroesophageal junction. Left-sided chest tube and mediastinal drain.  Normal heart size for level of inspiration. No pleural effusion or pneumothorax. No congestive failure. Mildly low lung volumes with left-greater-than-right basilar atelectasis.  IMPRESSION: 1. Endotracheal tube is difficult to follow centrally, favored to terminate at the level of the clavicles. This could be advanced 1-2 cm. 2. Otherwise, expected appearance after median sternotomy for CABG with bibasilar atelectasis but no pneumothorax or other acute complication. Electronically Signed   By: Jeronimo Greaves M.D.   On: 08/10/2021 14:43   ECHOCARDIOGRAM COMPLETE  Result Date: 08/07/2021    ECHOCARDIOGRAM REPORT   Patient Name:   Russell Miller Date of Exam: 08/07/2021 Medical Rec #:  [id number]       Height:       71.0 in Accession #:    [id number]      Weight:       200.0 lb Date of Birth:  September 08, 1970      BSA:          2.109 m Patient Age:    50 years        BP:           120/79 mmHg Patient Gender: M               HR:           87 bpm. Exam Location:  Inpatient Procedure: 2D Echo, Cardiac Doppler and Color Doppler Indications:    NSTEMI I21.4  History:        Patient has no prior history of Echocardiogram examinations.                 Cardiomyopathy, Previous Myocardial Infarction and CAD; Risk                 Factors:Dyslipidemia and Current Smoker.  Sonographer:    Lucendia Herrlich Referring Phys: [id number] Jonita Albee IMPRESSIONS  1. Windows are not well visualized. hypokinesis of the inferolateral/inferior walls. Left ventricular ejection fraction, by estimation, is 50 to 55%. The left ventricle has low normal function. There is mild left ventricular hypertrophy. Left ventricular diastolic parameters are consistent with Grade II diastolic dysfunction (pseudonormalization).  2. Right ventricular systolic function is normal. The right ventricular size is normal. Tricuspid regurgitation signal is inadequate for assessing PA pressure.  3. 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 Mild mitral valve regurgitation.   4. The aortic valve is tricuspid. Aortic valve regurgitation is not visualized.  5. The inferior vena cava is normal in size with greater than 50% respiratory variability, suggesting right atrial pressure of 3 mmHg. Comparison(s): No prior Echocardiogram. FINDINGS  Left Ventricle: Windows are not well visualized. hypokinesis of the inferolateral/inferior walls. Left ventricular ejection fraction, by estimation, is 50 to 55%. The left ventricle has low normal function. The left ventricular internal cavity size was normal in size. There is mild left ventricular hypertrophy. Left ventricular diastolic parameters are consistent with Grade II diastolic dysfunction (pseudonormalization). Right Ventricle: The right ventricular size is normal. Right ventricular systolic function is normal. Tricuspid regurgitation signal is inadequate for assessing PA pressure. Left Atrium: Left atrial size was normal in size. Right Atrium: Right atrial size was normal in size. Pericardium: There is no evidence of pericardial effusion. 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 There is mild thickening of the mitral valve leaflet(s). Mild mitral valve regurgitation. Tricuspid Valve: Tricuspid valve regurgitation is not demonstrated. Aortic Valve: The aortic valve is tricuspid. Aortic valve regurgitation is not visualized. Pulmonic Valve: Pulmonic valve regurgitation is not visualized. Aorta: The aortic root and ascending aorta are structurally normal, with no evidence of dilitation. Venous: The inferior vena cava is normal in size with greater than 50% respiratory  variability, suggesting right atrial pressure of 3 mmHg. IAS/Shunts: No atrial level shunt detected by color flow Doppler.  LEFT VENTRICLE PLAX 2D LVIDd:         1.95 cm   Diastology LVIDs:         4.25 cm   LV e' medial:    6.74 cm/s LV PW:         4.18 cm   LV E/e' medial:  13.8 LV IVS:        0.90 cm   LV e' lateral:   8.16 cm/s LVOT diam:     2.40 cm   LV E/e' lateral:  11.4 LV SV:         78 LV SV Index:   37 LVOT Area:     4.52 cm  RIGHT VENTRICLE TAPSE (M-mode): 2.1 cm LEFT ATRIUM             Index        RIGHT ATRIUM          Index LA diam:        3.60 cm 1.71 cm/m   RA Area:     9.95 cm LA Vol (A2C):   29.3 ml 13.90 ml/m  RA Volume:   20.60 ml 9.77 ml/m LA Vol (A4C):   45.9 ml 21.77 ml/m LA Biplane Vol: 38.2 ml 18.12 ml/m  AORTIC VALVE LVOT Vmax:   116.00 cm/s LVOT Vmean:  69.700 cm/s LVOT VTI:    0.172 m  AORTA Ao Root diam: 2.90 cm Ao Asc diam:  2.90 cm MITRAL VALVE MV Area (PHT): 6.48 cm    SHUNTS MV Decel Time: 117 msec    Systemic VTI:  0.17 m MV E velocity: 93.00 cm/s  Systemic Diam: 2.40 cm MV A velocity: 88.30 cm/s MV E/A ratio:  1.05 [place] signed by Carolan Clines Signature Date/Time: 08/07/2021/4:18:31 PM    Final    CARDIAC CATHETERIZATION  Result Date: 08/07/2021   Prox RCA to Mid RCA lesion is 100% stenosed.   Mid Cx to Dist Cx lesion is 99% stenosed.   2nd Mrg lesion is 99% stenosed.   Ost Cx to Prox Cx lesion is 70% stenosed.   1st Diag lesion is 90% stenosed.   Mid LAD-1 lesion is 90% stenosed.   Mid LAD-2 lesion is 50% stenosed.   There is mild left ventricular systolic dysfunction.   LV end diastolic pressure is normal.   The left ventricular ejection fraction is 45-50% by visual estimate. Severe triple vessel CAD The LAD is a large caliber vessel that courses to the apex. The mid LAD stented segment has severe restenosis. There is moderate stenosis just beyond the stented segment. The large caliber diagonal branch has severe ostial stenosis. The Circumflex is a large caliber vessel with moderately severe ostial stenosis. The mid stented segment involving the obtuse marginal branch has severe stent restenosis with TIMI-2 flow beyond the stent. The large, dominant RCA is completely occluded in the mid segment. The distal vessel and branches fill from left to right collaterals. Mild LV systolic dysfunction with inferior wall  hypokinesis. Normal LVEDP Recommendations: Given severe triple vessel CAD including severe stent restenosis, medical non-compliance and ongoing tobacco abuse, I think bypass surgery is his best option for complete revascularization and long term patency. Will continue ASA, statin, Resume IV heparin drip 2 hours post sheath pull. Echo pending. Will consult CT surgery to discuss CABG.   DG Chest 2 View  Result Date: 08/07/2021  CLINICAL DATA:  Chest pain EXAM: CHEST - 2 VIEW COMPARISON:  Chest radiograph 03/09/2009 FINDINGS: The cardiomediastinal silhouette is normal. There is no focal consolidation or pulmonary edema. There is no pleural effusion or pneumothorax There is no acute osseous abnormality. IMPRESSION: No radiographic evidence of acute cardiopulmonary process. Electronically Signed   By: Lesia Hausen M.D.   On: 08/07/2021 08:52    Discharge Instructions     Amb Referral to Cardiac Rehabilitation   Complete by: As directed    Diagnosis: CABG   CABG X ___: 5   After initial evaluation and assessments completed: Virtual Based Care may be provided alone or in conjunction with Phase 2 Cardiac Rehab based on patient barriers.: Yes       Discharge Medications: Allergies as of 08/14/2021   No Known Allergies      Medication List     TAKE these medications    acetaminophen 500 MG tablet Commonly known as: TYLENOL Take 1-2 tablets (500-1,000 mg total) by mouth every 6 (six) hours as needed for mild pain or fever.   aspirin 81 MG tablet Take 81 mg by mouth daily.   clopidogrel 75 MG tablet Commonly known as: PLAVIX Take 1 tablet (75 mg total) by mouth daily.   metoprolol tartrate 25 MG tablet Commonly known as: LOPRESSOR Take 1 tablet (25 mg total) by mouth 2 (two) times daily.   nicotine 14 mg/24hr patch Commonly known as: NICODERM CQ - dosed in mg/24 hours Place 1 patch (14 mg total) onto the skin daily.   nitroGLYCERIN 0.4 MG SL tablet Commonly known as: NITROSTAT Place  1 tablet (0.4 mg total) under the tongue every 5 (five) minutes as needed.   oxyCODONE 5 MG immediate release tablet Commonly known as: Oxy IR/ROXICODONE Take 1 tablet (5 mg total) by mouth every 4 (four) hours as needed for severe pain.   rosuvastatin 40 MG tablet Commonly known as: CRESTOR Take 1 tablet (40 mg total) by mouth daily.       The patient has been discharged on:   1.Beta Blocker:  Yes [   X]                              No   [   ]                              If No, reason:  2.Ace Inhibitor/ARB: Yes [   ]                                     No  [    X]                                     If No, reason: Labile BP  3.Statin:   Yes [   X]                  No  [   ]                  If No, reason:  4.Ecasa:  Yes  [  X ]  No   [   ]                  If No, reason:  Patient had ACS upon admission: Yes  Plavix/P2Y12 inhibitor: Yes [   X]                                      No  [   ]   Follow Up Appointments:  Follow-up Information     [name], MD Follow up on 09/23/2021.   Specialty: Cardiothoracic Surgery Why: PA/LAT CXR to be taken at 9:00am (at [facility] which is in the same building as Dr. Sharee Pimple office); Appointment time is at 9:30am. Contact information: [address] [employer] [address] [place] [address] [phone]         PCP Follow up.   Why: Obtain a primary medical doctor for further surveillance of HGA1C 5.8 (pre diabetes)        Louanne Skye Devoria Albe., NP Follow up.   Specialty: Nurse Practitioner Why: [facility] [address] location - a cardiology follow-up has been arranged for you on Monday Aug 31, 2021 at 9:30 AM (Arrive by 9:15 AM). Alden Server is one of the nurse practitioners that works with Dr. Mayford Knife and our cardiology team. Contact information: [address] [place] [address] [phone]         [facility] Follow up on  08/20/2021.   Specialty: Cardiothoracic Surgery Why: Appointment is at 11:00am for suture removal with the nurse. Contact information: [address] [place], [address] [place] [address] [phone]                Signed: Jenny Reichmann, PA-C 08/14/2021, 1:05 PM

## 2021-08-10 NOTE — Progress Notes (Signed)
  Echocardiogram Echocardiogram Transesophageal has been performed.  Russell Miller 08/10/2021, 8:33 AM 

## 2021-08-10 NOTE — Anesthesia Postprocedure Evaluation (Signed)
Anesthesia Post Note  Patient: Russell Miller  Procedure(s) Performed: CORONARY ARTERY BYPASS GRAFTING (CABG) TIMES 5, USING LEFT INTERNAL MAMMARY ARTERY AND RIGHT GREATER SAPHENOUS VEIN HARVESTED ENDOSCOPICALLY (Chest) TRANSESOPHAGEAL ECHOCARDIOGRAM (TEE)     Patient location during evaluation: SICU Anesthesia Type: General Level of consciousness: sedated Pain management: pain level controlled Vital Signs Assessment: post-procedure vital signs reviewed and stable Respiratory status: patient remains intubated per anesthesia plan Cardiovascular status: stable Postop Assessment: no apparent nausea or vomiting Anesthetic complications: no   No notable events documented.  Last Vitals:  Vitals:   08/10/21 1414 08/10/21 1416  BP:    Pulse:  73  Resp:    Temp:    SpO2: 100% 100%    Last Pain:  Vitals:   08/10/21 0556  TempSrc: Oral  PainSc: 0-No pain                 Cassanda Walmer

## 2021-08-10 NOTE — Interval H&P Note (Signed)
History and Physical Interval Note:  08/10/2021 6:55 AM  Russell Miller  has presented today for surgery, with the diagnosis of coronary artery disease.  The various methods of treatment have been discussed with the patient and family. After consideration of risks, benefits and other options for treatment, the patient has consented to  Procedure(s): CORONARY ARTERY BYPASS GRAFTING (CABG), ENDOVEIN HARVEST (N/A) TRANSESOPHAGEAL ECHOCARDIOGRAM (TEE) (N/A) as a surgical intervention.  The patient's history has been reviewed, patient examined, no change in status, stable for surgery.  I have reviewed the patient's chart and labs.  Questions were answered to the patient's satisfaction.     [name]

## 2021-08-11 ENCOUNTER — Telehealth: Payer: Self-pay | Admitting: Physician Assistant

## 2021-08-11 ENCOUNTER — Inpatient Hospital Stay (HOSPITAL_COMMUNITY): Payer: Managed Care, Other (non HMO)

## 2021-08-11 ENCOUNTER — Encounter (HOSPITAL_COMMUNITY): Payer: Self-pay | Admitting: Surgery

## 2021-08-11 LAB — CBC
HCT: 33.6 % — ABNORMAL LOW (ref 39.0–52.0)
HCT: 36.9 % — ABNORMAL LOW (ref 39.0–52.0)
Hemoglobin: 11.6 g/dL — ABNORMAL LOW (ref 13.0–17.0)
Hemoglobin: 12.8 g/dL — ABNORMAL LOW (ref 13.0–17.0)
MCH: 31.9 pg (ref 26.0–34.0)
MCH: 32.1 pg (ref 26.0–34.0)
MCHC: 34.5 g/dL (ref 30.0–36.0)
MCHC: 34.7 g/dL (ref 30.0–36.0)
MCV: 92.3 fL (ref 80.0–100.0)
MCV: 92.5 fL (ref 80.0–100.0)
Platelets: 164 10*3/uL (ref 150–400)
Platelets: 200 10*3/uL (ref 150–400)
RBC: 3.64 MIL/uL — ABNORMAL LOW (ref 4.22–5.81)
RBC: 3.99 MIL/uL — ABNORMAL LOW (ref 4.22–5.81)
RDW: 12 % (ref 11.5–15.5)
RDW: 12 % (ref 11.5–15.5)
WBC: 16 10*3/uL — ABNORMAL HIGH (ref 4.0–10.5)
WBC: 19.7 10*3/uL — ABNORMAL HIGH (ref 4.0–10.5)
nRBC: 0 % (ref 0.0–0.2)
nRBC: 0 % (ref 0.0–0.2)

## 2021-08-11 LAB — BASIC METABOLIC PANEL
Anion gap: 4 — ABNORMAL LOW (ref 5–15)
Anion gap: 8 (ref 5–15)
BUN: 7 mg/dL (ref 6–20)
BUN: 10 mg/dL (ref 6–20)
CO2: 23 mmol/L (ref 22–32)
CO2: 26 mmol/L (ref 22–32)
Calcium: 8 mg/dL — ABNORMAL LOW (ref 8.9–10.3)
Calcium: 8.6 mg/dL — ABNORMAL LOW (ref 8.9–10.3)
Chloride: 108 mmol/L (ref 98–111)
Chloride: 98 mmol/L (ref 98–111)
Creatinine, Ser: 0.74 mg/dL (ref 0.61–1.24)
Creatinine, Ser: 0.96 mg/dL (ref 0.61–1.24)
GFR, Estimated: >60 mL/min (ref >60)
GFR, Estimated: >60 mL/min (ref >60)
Glucose, Bld: 120 mg/dL — ABNORMAL HIGH (ref 70–99)
Glucose, Bld: 151 mg/dL — ABNORMAL HIGH (ref 70–99)
Potassium: 4.4 mmol/L (ref 3.5–5.1)
Potassium: 4.2 mmol/L (ref 3.5–5.1)
Sodium: 135 mmol/L (ref 135–145)
Sodium: 132 mmol/L — ABNORMAL LOW (ref 135–145)

## 2021-08-11 LAB — GLUCOSE, CAPILLARY
Glucose-Capillary: 114 mg/dL — ABNORMAL HIGH (ref 70–99)
Glucose-Capillary: 117 mg/dL — ABNORMAL HIGH (ref 70–99)
Glucose-Capillary: 117 mg/dL — ABNORMAL HIGH (ref 70–99)
Glucose-Capillary: 119 mg/dL — ABNORMAL HIGH (ref 70–99)
Glucose-Capillary: 121 mg/dL — ABNORMAL HIGH (ref 70–99)
Glucose-Capillary: 127 mg/dL — ABNORMAL HIGH (ref 70–99)
Glucose-Capillary: 128 mg/dL — ABNORMAL HIGH (ref 70–99)
Glucose-Capillary: 136 mg/dL — ABNORMAL HIGH (ref 70–99)
Glucose-Capillary: 137 mg/dL — ABNORMAL HIGH (ref 70–99)

## 2021-08-11 LAB — MAGNESIUM
Magnesium: 2.4 mg/dL (ref 1.7–2.4)
Magnesium: 2.3 mg/dL (ref 1.7–2.4)

## 2021-08-11 MED ORDER — ENOXAPARIN SODIUM 40 MG/0.4ML IJ SOSY
40.0000 mg | PREFILLED_SYRINGE | Freq: Every day | INTRAMUSCULAR | Status: DC
Start: 2021-08-11 — End: 2021-08-14
  Administered 2021-08-11 – 2021-08-13 (×3): 40 mg via SUBCUTANEOUS
  Filled 2021-08-11 (×3): qty 0.4

## 2021-08-11 MED ORDER — FUROSEMIDE 10 MG/ML IJ SOLN
40.0000 mg | Freq: Two times a day (BID) | INTRAMUSCULAR | Status: AC
  Administered 2021-08-11 (×2): 40 mg via INTRAVENOUS
  Filled 2021-08-11 (×2): qty 4

## 2021-08-11 MED ORDER — LORAZEPAM 1 MG PO TABS
1.0000 mg | ORAL_TABLET | Freq: Four times a day (QID) | ORAL | Status: DC | PRN
  Administered 2021-08-11: 1 mg via ORAL
  Filled 2021-08-11: qty 1

## 2021-08-11 MED ORDER — METOPROLOL TARTRATE 25 MG/10 ML ORAL SUSPENSION: 12.5000 mg | Freq: Two times a day (BID) | ORAL | Status: DC

## 2021-08-11 MED ORDER — INSULIN ASPART 100 UNIT/ML IJ SOLN
0.0000 [IU] | Freq: Three times a day (TID) | INTRAMUSCULAR | Status: DC
  Administered 2021-08-12: 2 [IU] via SUBCUTANEOUS

## 2021-08-11 MED ORDER — INSULIN DETEMIR 100 UNIT/ML ~~LOC~~ SOLN
10.0000 [IU] | Freq: Every day | SUBCUTANEOUS | Status: DC
  Administered 2021-08-11: 10 [IU] via SUBCUTANEOUS
  Filled 2021-08-11 (×2): qty 0.1

## 2021-08-11 MED ORDER — METOPROLOL TARTRATE 25 MG PO TABS
25.0000 mg | ORAL_TABLET | Freq: Two times a day (BID) | ORAL | Status: DC
  Administered 2021-08-11: 25 mg via ORAL
  Filled 2021-08-11 (×2): qty 1

## 2021-08-11 MED ORDER — INSULIN ASPART 100 UNIT/ML IJ SOLN
0.0000 [IU] | INTRAMUSCULAR | Status: DC
  Administered 2021-08-11 (×2): 2 [IU] via SUBCUTANEOUS

## 2021-08-11 MED FILL — Thrombin (Recombinant) For Soln 20000 Unit: CUTANEOUS | Qty: 1 | Status: AC

## 2021-08-11 NOTE — TOC Initial Note (Signed)
Transition of Care [facility]) - Initial/Assessment Note    Patient Details  Name: Russell Miller MRN: [id number] Date of Birth: 04/27/1970  [facility] CM/SW Contact:    Gala Lewandowsky, RN Phone Number: 08/11/2021, 2:40 PM  Clinical Narrative:  Patient presented for chest pain. Post LHC- CVTS consulted for CABG. POD-1 CABG. Patient was discussed in rounds and patient is up to chair. PTA patient has family support. Case Manager will continue to follow for transition of care needs as the patient progresses.   Expected Discharge Plan: Home/Self Care Barriers to Discharge: Continued Medical Work up  Expected Discharge Plan and Services Expected Discharge Plan: Home/Self Care In-house Referral: NA Discharge Planning Services: CM Consult Post Acute Care Choice: NA Living arrangements for the past 2 months: Single Family Home                  Prior Living Arrangements/Services Living arrangements for the past 2 months: Single Family Home Lives with:: Spouse Patient language and need for interpreter reviewed:: Yes        Need for Family Participation in Patient Care: Yes (Comment) Care giver support system in place?: Yes (comment)   Criminal Activity/Legal Involvement Pertinent to Current Situation/Hospitalization: No - Comment as needed  Activities of Daily Living Home Assistive Devices/Equipment: None ADL Screening (condition at time of admission) Patient's cognitive ability adequate to safely complete daily activities?: Yes Is the patient deaf or have difficulty hearing?: No Does the patient have difficulty seeing, even when wearing glasses/contacts?: No Does the patient have difficulty concentrating, remembering, or making decisions?: No Patient able to express need for assistance with ADLs?: Yes Does the patient have difficulty dressing or bathing?: No Independently performs ADLs?: Yes (appropriate for developmental age) Does the patient have difficulty  walking or climbing stairs?: No Weakness of Legs: None Weakness of Arms/Hands: None  Emotional Assessment Appearance:: Appears stated age       Alcohol / Substance Use: Not Applicable Psych Involvement: No (comment)  Admission diagnosis:  NSTEMI (non-ST elevated myocardial infarction) (HCC) [I21.4] Chest pain, unspecified type [R07.9] Coronary artery disease [I25.10] Patient Active Problem List   Diagnosis Date Noted   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   Coronary artery disease 08/10/2021   NSTEMI (non-ST elevated myocardial infarction) (HCC) 08/07/2021   Coronary artery disease due to lipid rich plaque    Pure hypercholesterolemia    OLD MYOCARDIAL INFARCTION 04/18/2009   SINUSITIS, CHRONIC 04/17/2009   HYPERLIPIDEMIA 03/18/2009   TOBACCO ABUSE 03/18/2009   HYPERGLYCEMIA 03/18/2009   CAD, NATIVE VESSEL 03/10/2009   PCP:  Patient, No Pcp Per Pharmacy:   [id number] [place], [place] - [address] AT [facility] OF [facility] [address] [place] [address] Phone: [phone] Fax: [phone]  [id number] [place], [place] - [address] AT [facility] OF [address] [place] [address] Phone: [phone] Fax: [phone]  Readmission Risk Interventions     No data to display

## 2021-08-11 NOTE — Discharge Instructions (Signed)
Discharge Instructions:  1. You may shower, please wash incisions daily with soap and water and keep dry.  If you wish to cover wounds with dressing you may do so but please keep clean and change daily.  No tub baths or swimming until incisions have completely healed.  If your incisions become red or develop any drainage please call our office at [phone]  2. No Driving until cleared by Dr. Sharee Pimple office and you are no longer using narcotic pain medications  3. Monitor your weight daily.. Please use the same scale and weigh at same time... If you gain 5-10 lbs in 48 hours with associated lower extremity swelling, please contact our office at [phone]  4. Fever of 101.5 for at least 24 hours with no source, please contact our office at [phone]  5. Activity- up as tolerated, please walk at least 3 times per day.  Avoid strenuous activity, no lifting, pushing, or pulling with your arms over 8-10 lbs for a minimum of 6 weeks  6. If any questions or concerns arise, please do not hesitate to contact our office at [phone]  Prediabetes Eating Plan Prediabetes is a condition that causes blood sugar (glucose) levels to be higher than normal. This increases the risk for developing type 2 diabetes (type 2 diabetes mellitus). Working with a health care provider or nutrition specialist (dietitian) to make diet and lifestyle changes can help prevent the onset of diabetes. These changes may help you: Control your blood glucose levels. Improve your cholesterol levels. Manage your blood pressure. What are tips for following this plan? Reading food labels Read food labels to check the amount of fat, salt (sodium), and sugar in prepackaged foods. Avoid foods that have: Saturated fats. Trans fats. Added sugars. Avoid foods that have more than 300 milligrams (mg) of sodium per serving. Limit your sodium intake to less than 2,300 mg each day. Shopping Avoid buying pre-made and processed  foods. Avoid buying drinks with added sugar. Cooking Cook with olive oil. Do not use butter, lard, or ghee. Bake, broil, grill, steam, or boil foods. Avoid frying. Meal planning  Work with your dietitian to create an eating plan that is right for you. This may include tracking how many calories you take in each day. Use a food diary, notebook, or mobile application to track what you eat at each meal. Consider following a Mediterranean diet. This includes: Eating several servings of fresh fruits and vegetables each day. Eating fish at least twice a week. Eating one serving each day of whole grains, beans, nuts, and seeds. Using olive oil instead of other fats. Limiting alcohol. Limiting red meat. Using nonfat or low-fat dairy products. Consider following a plant-based diet. This includes dietary choices that focus on eating mostly vegetables and fruit, grains, beans, nuts, and seeds. If you have high blood pressure, you may need to limit your sodium intake or follow a diet such as the DASH (Dietary Approaches to Stop Hypertension) eating plan. The DASH diet aims to lower high blood pressure. Lifestyle Set weight loss goals with help from your health care team. It is recommended that most people with prediabetes lose 7% of their body weight. Exercise for at least 30 minutes 5 or more days a week. Attend a support group or seek support from a mental health counselor. Take over-the-counter and prescription medicines only as told by your health care provider. What foods are recommended? Fruits Berries. Bananas. Apples. Oranges. Grapes. Papaya. Mango. Pomegranate. Kiwi. Grapefruit. Cherries. Vegetables Lettuce. Spinach.  Peas. Beets. Cauliflower. Cabbage. Broccoli. Carrots. Tomatoes. Squash. Eggplant. Herbs. Peppers. Onions. Cucumbers. Brussels sprouts. Grains Whole grains, such as whole-wheat or whole-grain breads, crackers, cereals, and pasta. Unsweetened oatmeal. Bulgur. Barley. Quinoa.  Brown rice. Corn or whole-wheat flour tortillas or taco shells. Meats and other proteins Seafood. Poultry without skin. Lean cuts of pork and beef. Tofu. Eggs. Nuts. Beans. Dairy Low-fat or fat-free dairy products, such as yogurt, cottage cheese, and cheese. Beverages Water. Tea. Coffee. Sugar-free or diet soda. Seltzer water. Low-fat or nonfat milk. Milk alternatives, such as soy or almond milk. Fats and oils Olive oil. Canola oil. Sunflower oil. Grapeseed oil. Avocado. Walnuts. Sweets and desserts Sugar-free or low-fat pudding. Sugar-free or low-fat ice cream and other frozen treats. Seasonings and condiments Herbs. Sodium-free spices. Mustard. Relish. Low-salt, low-sugar ketchup. Low-salt, low-sugar barbecue sauce. Low-fat or fat-free mayonnaise. The items listed above may not be a complete list of recommended foods and beverages. Contact a dietitian for more information. What foods are not recommended? Fruits Fruits canned with syrup. Vegetables Canned vegetables. Frozen vegetables with butter or cream sauce. Grains Refined white flour and flour products, such as bread, pasta, snack foods, and cereals. Meats and other proteins Fatty cuts of meat. Poultry with skin. Breaded or fried meat. Processed meats. Dairy Full-fat yogurt, cheese, or milk. Beverages Sweetened drinks, such as iced tea and soda. Fats and oils Butter. Lard. Ghee. Sweets and desserts Baked goods, such as cake, cupcakes, pastries, cookies, and cheesecake. Seasonings and condiments Spice mixes with added salt. Ketchup. Barbecue sauce. Mayonnaise. The items listed above may not be a complete list of foods and beverages that are not recommended. Contact a dietitian for more information. Where to find more information American Diabetes Association: www.diabetes.org Summary You may need to make diet and lifestyle changes to help prevent the onset of diabetes. These changes can help you control blood sugar, improve  cholesterol levels, and manage blood pressure. Set weight loss goals with help from your health care team. It is recommended that most people with prediabetes lose 7% of their body weight. Consider following a Mediterranean diet. This includes eating plenty of fresh fruits and vegetables, whole grains, beans, nuts, seeds, fish, and low-fat dairy, and using olive oil instead of other fats. This information is not intended to replace advice given to you by your health care provider. Make sure you discuss any questions you have with your health care provider. Document Revised: 03/29/2019 Document Reviewed: 03/29/2019 Elsevier Patient Education  2023 [employer].

## 2021-08-11 NOTE — Progress Notes (Signed)
1 Day Post-Op Procedure(s) (LRB): CORONARY ARTERY BYPASS GRAFTING (CABG) TIMES 5, USING LEFT INTERNAL MAMMARY ARTERY AND RIGHT GREATER SAPHENOUS VEIN HARVESTED ENDOSCOPICALLY (N/A) TRANSESOPHAGEAL ECHOCARDIOGRAM (TEE) (N/A) Subjective: No complaints.  Objective: Vital signs in last 24 hours: Temp:  [96.8 F (36 C)-98.6 F (37 C)] 98.4 F (36.9 C) (08/01 0630) Pulse Rate:  [72-94] 79 (08/01 0630) Cardiac Rhythm: Normal sinus rhythm (08/01 0400) Resp:  [0-32] 16 (08/01 0630) BP: (87-147)/(57-101) 101/68 (08/01 0630) SpO2:  [91 %-100 %] 99 % (08/01 0630) Arterial Line BP: (90-146)/(47-76) 107/47 (08/01 0630) FiO2 (%):  [40 %-50 %] 40 % (07/31 1621) Weight:  [88.9 kg] 88.9 kg (08/01 0500)  Hemodynamic parameters for last 24 hours: PAP: (14-41)/(2-29) 15/2 CO:  [3.5 L/min-7.3 L/min] 5.8 L/min CI:  [1.6 L/min/m2-3.4 L/min/m2] 2.7 L/min/m2  Intake/Output from previous day: 07/31 0701 - 08/01 0700 In: 5165.4 [I.V.:3236; Blood:320; IV Piggyback:1609.4] Out: 3762 [Urine:2710; Emesis/NG output:10; Blood:492; Chest Tube:550] Intake/Output this shift: Total I/O In: 1698.4 [I.V.:817.1; IV Piggyback:881.3] Out: 925 [Urine:625; Chest Tube:300]  General appearance: alert and cooperative Neurologic: intact Heart: regular rate and rhythm, S1, S2 normal, no murmur Lungs: clear to auscultation bilaterally Extremities: edema mild Wound: dressings dry  Lab Results: Recent Labs    08/10/21 1909 08/11/21 0243  WBC 20.1* 16.0*  HGB 12.4* 11.6*  HCT 34.9* 33.6*  PLT 179 164   BMET:  Recent Labs    08/10/21 1909 08/11/21 0243  NA 137 135  K 4.4 4.4  CL 110 108  CO2 22 23  GLUCOSE 150* 120*  BUN 7 7  CREATININE 0.71 0.74  CALCIUM 7.7* 8.0*    PT/INR:  Recent Labs    08/10/21 1353  LABPROT 15.5*  INR 1.2   ABG    Component Value Date/Time   PHART 7.334 (L) 08/10/2021 1731   HCO3 21.5 08/10/2021 1731   TCO2 23 08/10/2021 1731   ACIDBASEDEF 4.0 (H) 08/10/2021 1731    O2SAT 98 08/10/2021 1731   CBG (last 3)  Recent Labs    08/11/21 0057 08/11/21 0245 08/11/21 0513  GLUCAP 114* 117* 121*   CXR: mild bibasilar atelectasis  ECG: sinus, early repol.  Assessment/Plan: S/P Procedure(s) (LRB): CORONARY ARTERY BYPASS GRAFTING (CABG) TIMES 5, USING LEFT INTERNAL MAMMARY ARTERY AND RIGHT GREATER SAPHENOUS VEIN HARVESTED ENDOSCOPICALLY (N/A) TRANSESOPHAGEAL ECHOCARDIOGRAM (TEE) (N/A)  POD 1  Hemodynamically stable in sinus rhythm.  Dangle again and DC chest tubes, swan, arterial line.  Start diuresis.  Preop Hgb A1c was 5.8. Transition to Levemir and SSI this am.  OOB, IS, mobilize.  Continue ASA. Plan to add Plavix once pacing wires out.   LOS: 4 days    Russell Miller 08/11/2021

## 2021-08-11 NOTE — Telephone Encounter (Signed)
-----   Message from Ardelle Balls, [place] sent at 08/11/2021  8:04 AM EDT ----- Regarding: Follow Up Appointment This patient is s/p CABG x 5 by Dr. [name] on 08/10/2021. Cardiologist is Dr. Mayford Knife. Patient needs a 2 week follow up from 08/07. Thank you.

## 2021-08-11 NOTE — Progress Notes (Signed)
CT surgery PM rounds  Patient comfortable this evening after CABG yesterday Was up to chair, chest tubes and Swan out Maintaining sinus rhythm P.m. labs satisfactory with white count slightly increased from 16>> 19 K probable inflammatory reaction   good urine output  Blood pressure (!) 144/96, pulse (!) 109, temperature 98 F (36.7 C), temperature source Oral, resp. rate 19, height 5\' 11"  (1.803 m), weight 88.9 kg, SpO2 96 %.

## 2021-08-11 NOTE — Telephone Encounter (Signed)
   I am covering cardmaster today and received the following message to inbox. As requested, appt scheduled 8/21 with Robin Searing, placed appt into on AVS.  This patient will need a TOC phone call after discharge. They are not being discharged yet so please follow chart for day of discharge then initiate call.  They are a patient of Dr. Mayford Knife.  Thank you! Laurann Montana, PA-C

## 2021-08-12 ENCOUNTER — Inpatient Hospital Stay (HOSPITAL_COMMUNITY): Payer: Managed Care, Other (non HMO)

## 2021-08-12 LAB — CBC
HCT: 32.8 % — ABNORMAL LOW (ref 39.0–52.0)
Hemoglobin: 11.2 g/dL — ABNORMAL LOW (ref 13.0–17.0)
MCH: 31.7 pg (ref 26.0–34.0)
MCHC: 34.1 g/dL (ref 30.0–36.0)
MCV: 92.9 fL (ref 80.0–100.0)
Platelets: 174 10*3/uL (ref 150–400)
RBC: 3.53 MIL/uL — ABNORMAL LOW (ref 4.22–5.81)
RDW: 12.1 % (ref 11.5–15.5)
WBC: 14 10*3/uL — ABNORMAL HIGH (ref 4.0–10.5)
nRBC: 0 % (ref 0.0–0.2)

## 2021-08-12 LAB — GLUCOSE, CAPILLARY
Glucose-Capillary: 122 mg/dL — ABNORMAL HIGH (ref 70–99)
Glucose-Capillary: 126 mg/dL — ABNORMAL HIGH (ref 70–99)
Glucose-Capillary: 127 mg/dL — ABNORMAL HIGH (ref 70–99)

## 2021-08-12 LAB — BASIC METABOLIC PANEL
Anion gap: 7 (ref 5–15)
BUN: 9 mg/dL (ref 6–20)
CO2: 30 mmol/L (ref 22–32)
Calcium: 8.5 mg/dL — ABNORMAL LOW (ref 8.9–10.3)
Chloride: 98 mmol/L (ref 98–111)
Creatinine, Ser: 0.82 mg/dL (ref 0.61–1.24)
GFR, Estimated: >60 mL/min (ref >60)
Glucose, Bld: 113 mg/dL — ABNORMAL HIGH (ref 70–99)
Potassium: 4 mmol/L (ref 3.5–5.1)
Sodium: 135 mmol/L (ref 135–145)

## 2021-08-12 MED ORDER — DOCUSATE SODIUM 100 MG PO CAPS
200.0000 mg | ORAL_CAPSULE | Freq: Every day | ORAL | Status: DC
  Administered 2021-08-12 – 2021-08-13 (×2): 200 mg via ORAL
  Filled 2021-08-12: qty 2

## 2021-08-12 MED ORDER — CHLORHEXIDINE GLUCONATE CLOTH 2 % EX PADS
6.0000 | MEDICATED_PAD | Freq: Every day | CUTANEOUS | Status: DC
  Administered 2021-08-13 – 2021-08-14 (×2): 6 via TOPICAL

## 2021-08-12 MED ORDER — SODIUM CHLORIDE 0.9% FLUSH
3.0000 mL | Freq: Two times a day (BID) | INTRAVENOUS | Status: DC
  Administered 2021-08-12 – 2021-08-14 (×5): 3 mL via INTRAVENOUS

## 2021-08-12 MED ORDER — SODIUM CHLORIDE 0.9% FLUSH: 3.0000 mL | INTRAVENOUS | Status: DC | PRN

## 2021-08-12 MED ORDER — ONDANSETRON HCL 4 MG/2ML IJ SOLN: 4.0000 mg | Freq: Four times a day (QID) | INTRAMUSCULAR | Status: DC | PRN

## 2021-08-12 MED ORDER — METOPROLOL TARTRATE 12.5 MG HALF TABLET
12.5000 mg | ORAL_TABLET | Freq: Two times a day (BID) | ORAL | Status: DC
Start: 2021-08-12 — End: 2021-08-14
  Administered 2021-08-12 – 2021-08-13 (×4): 12.5 mg via ORAL
  Filled 2021-08-12 (×3): qty 1

## 2021-08-12 MED ORDER — ONDANSETRON HCL 4 MG PO TABS
4.0000 mg | ORAL_TABLET | Freq: Four times a day (QID) | ORAL | Status: DC | PRN
  Administered 2021-08-12: 4 mg via ORAL
  Filled 2021-08-12: qty 1

## 2021-08-12 MED ORDER — BISACODYL 10 MG RE SUPP
10.0000 mg | Freq: Every day | RECTAL | Status: DC | PRN
  Administered 2021-08-14: 10 mg via RECTAL
  Filled 2021-08-12: qty 1

## 2021-08-12 MED ORDER — TRAMADOL HCL 50 MG PO TABS: 50.0000 mg | ORAL_TABLET | ORAL | Status: DC | PRN

## 2021-08-12 MED ORDER — FUROSEMIDE 40 MG PO TABS
40.0000 mg | ORAL_TABLET | Freq: Every day | ORAL | Status: DC
  Administered 2021-08-12: 40 mg via ORAL
  Filled 2021-08-12: qty 1

## 2021-08-12 MED ORDER — BISACODYL 5 MG PO TBEC
10.0000 mg | DELAYED_RELEASE_TABLET | Freq: Every day | ORAL | Status: DC | PRN
  Administered 2021-08-13: 10 mg via ORAL
  Filled 2021-08-12: qty 2

## 2021-08-12 MED ORDER — PANTOPRAZOLE SODIUM 40 MG PO TBEC
40.0000 mg | DELAYED_RELEASE_TABLET | Freq: Every day | ORAL | Status: DC
  Administered 2021-08-12 – 2021-08-14 (×3): 40 mg via ORAL
  Filled 2021-08-12 (×2): qty 1

## 2021-08-12 MED ORDER — ~~LOC~~ CARDIAC SURGERY, PATIENT & FAMILY EDUCATION: Freq: Once | Status: AC

## 2021-08-12 MED ORDER — OXYCODONE HCL 5 MG PO TABS
5.0000 mg | ORAL_TABLET | ORAL | Status: DC | PRN
  Administered 2021-08-12 – 2021-08-13 (×4): 10 mg via ORAL
  Filled 2021-08-12 (×4): qty 2

## 2021-08-12 MED ORDER — ASPIRIN 325 MG PO TBEC
325.0000 mg | DELAYED_RELEASE_TABLET | Freq: Every day | ORAL | Status: DC
  Administered 2021-08-12: 325 mg via ORAL

## 2021-08-12 MED ORDER — POTASSIUM CHLORIDE CRYS ER 20 MEQ PO TBCR
20.0000 meq | EXTENDED_RELEASE_TABLET | Freq: Every day | ORAL | Status: AC
  Administered 2021-08-12 – 2021-08-13 (×2): 20 meq via ORAL
  Filled 2021-08-12 (×2): qty 1

## 2021-08-12 MED ORDER — SODIUM CHLORIDE 0.9 % IV SOLN: 250.0000 mL | INTRAVENOUS | Status: DC | PRN

## 2021-08-12 NOTE — Progress Notes (Signed)
2 Days Post-Op Procedure(s) (LRB): CORONARY ARTERY BYPASS GRAFTING (CABG) TIMES 5, USING LEFT INTERNAL MAMMARY ARTERY AND RIGHT GREATER SAPHENOUS VEIN HARVESTED ENDOSCOPICALLY (N/A) TRANSESOPHAGEAL ECHOCARDIOGRAM (TEE) (N/A) Subjective: Patient is sitting up in chair, states he felt great this morning but is tired after his walk.  Objective: Vital signs in last 24 hours: Temp:  [97.7 F (36.5 C)-98.8 F (37.1 C)] 98.5 F (36.9 C) (08/02 0300) Pulse Rate:  [83-111] 92 (08/02 0600) Cardiac Rhythm: Sinus tachycardia (08/01 2000) Resp:  [11-30] 14 (08/02 0600) BP: (98-151)/(70-114) 116/77 (08/02 0600) SpO2:  [91 %-100 %] 92 % (08/02 0600) Arterial Line BP: (132-151)/(62-69) 138/66 (08/01 1000) Weight:  [86.9 kg] 86.9 kg (08/02 0500)  Hemodynamic parameters for last 24 hours: PAP: (19-21)/(6-12) 21/9 CO:  [5.7 L/min] 5.7 L/min CI:  [2.6 L/min/m2] 2.6 L/min/m2  Intake/Output from previous day: 08/01 0701 - 08/02 0700 In: 745.9 [I.V.:311; IV Piggyback:434.9] Out: 2520 [Urine:2350; Chest Tube:170] Intake/Output this shift: No intake/output data recorded.  General appearance: alert, cooperative, and no distress Neurologic: intact Heart: regular rate and rhythm, S1, S2 normal, no murmur, click, rub or gallop and regular rate and rhythm Lungs: clear to auscultation bilaterally Abdomen: soft, non-tender; bowel sounds normal; no masses,  no organomegaly Extremities: minimal edema Wound: clean and dry  Lab Results: Recent Labs    08/11/21 1648 08/12/21 0433  WBC 19.7* 14.0*  HGB 12.8* 11.2*  HCT 36.9* 32.8*  PLT 200 174   BMET:  Recent Labs    08/11/21 1648 08/12/21 0433  NA 132* 135  K 4.2 4.0  CL 98 98  CO2 26 30  GLUCOSE 151* 113*  BUN 10 9  CREATININE 0.96 0.82  CALCIUM 8.6* 8.5*    PT/INR:  Recent Labs    08/10/21 1353  LABPROT 15.5*  INR 1.2   ABG    Component Value Date/Time   PHART 7.334 (L) 08/10/2021 1731   HCO3 21.5 08/10/2021 1731   TCO2 23  08/10/2021 1731   ACIDBASEDEF 4.0 (H) 08/10/2021 1731   O2SAT 98 08/10/2021 1731   CBG (last 3)  Recent Labs    08/11/21 2010 08/12/21 0042 08/12/21 0633  GLUCAP 117* 127* 122*    Assessment/Plan: S/P Procedure(s) (LRB): CORONARY ARTERY BYPASS GRAFTING (CABG) TIMES 5, USING LEFT INTERNAL MAMMARY ARTERY AND RIGHT GREATER SAPHENOUS VEIN HARVESTED ENDOSCOPICALLY (N/A) TRANSESOPHAGEAL ECHOCARDIOGRAM (TEE) (N/A)  1.CV: Patient is in NSR. BP stable 116/70 continue Lopressor 12.5 BID. 2.Pulm: Patient is satting well at 94 on 3L [place]. Expected cardiomegaly and atelectasis on CXR, CXR ordered for tomorrow to monitor. Continue IS and ambulation. 3.GI: Preop A1C 5.8, glucose stable at 121/122. Will stop Levemir. 4.Renal: Good urine output. Will remove catheter today. Cr stable. 5.Extremities: Minimal edema, continue PO lasix 40mg  and KCl 20mg  6.ID: WBC down to 14,000 from 19,000 yesterday. Wounds are clean and dry without signs of infection. 7. Expected postop blood loss anemia HGB/HCT 11.2/32.8. Will continue to monitor. 7.Dispo: D/C central line and foley catheter and transfer to 4E progresive unit.   LOS: 5 days    08/12/2021

## 2021-08-13 ENCOUNTER — Inpatient Hospital Stay (HOSPITAL_COMMUNITY): Payer: Managed Care, Other (non HMO)

## 2021-08-13 LAB — BASIC METABOLIC PANEL
Anion gap: 5 (ref 5–15)
BUN: 9 mg/dL (ref 6–20)
CO2: 29 mmol/L (ref 22–32)
Calcium: 8.3 mg/dL — ABNORMAL LOW (ref 8.9–10.3)
Chloride: 100 mmol/L (ref 98–111)
Creatinine, Ser: 0.8 mg/dL (ref 0.61–1.24)
GFR, Estimated: >60 mL/min (ref >60)
Glucose, Bld: 98 mg/dL (ref 70–99)
Potassium: 3.7 mmol/L (ref 3.5–5.1)
Sodium: 134 mmol/L — ABNORMAL LOW (ref 135–145)

## 2021-08-13 LAB — CBC
HCT: 30.7 % — ABNORMAL LOW (ref 39.0–52.0)
Hemoglobin: 10.6 g/dL — ABNORMAL LOW (ref 13.0–17.0)
MCH: 32.2 pg (ref 26.0–34.0)
MCHC: 34.5 g/dL (ref 30.0–36.0)
MCV: 93.3 fL (ref 80.0–100.0)
Platelets: 172 10*3/uL (ref 150–400)
RBC: 3.29 MIL/uL — ABNORMAL LOW (ref 4.22–5.81)
RDW: 12.1 % (ref 11.5–15.5)
WBC: 12.2 10*3/uL — ABNORMAL HIGH (ref 4.0–10.5)
nRBC: 0 % (ref 0.0–0.2)

## 2021-08-13 MED ORDER — ASPIRIN 81 MG PO TBEC
81.0000 mg | DELAYED_RELEASE_TABLET | Freq: Every day | ORAL | Status: DC
  Administered 2021-08-13 – 2021-08-14 (×2): 81 mg via ORAL
  Filled 2021-08-13 (×2): qty 1

## 2021-08-13 MED ORDER — CLOPIDOGREL BISULFATE 75 MG PO TABS
75.0000 mg | ORAL_TABLET | Freq: Every day | ORAL | Status: DC
  Administered 2021-08-14: 75 mg via ORAL
  Filled 2021-08-13 (×2): qty 1

## 2021-08-13 MED ORDER — LACTULOSE 10 GM/15ML PO SOLN
10.0000 g | Freq: Once | ORAL | Status: AC
  Administered 2021-08-13: 10 g via ORAL
  Filled 2021-08-13: qty 15

## 2021-08-13 NOTE — Progress Notes (Addendum)
3 Days Post-Op Procedure(s) (LRB): CORONARY ARTERY BYPASS GRAFTING (CABG) TIMES 5, USING LEFT INTERNAL MAMMARY ARTERY AND RIGHT GREATER SAPHENOUS VEIN HARVESTED ENDOSCOPICALLY (N/A) TRANSESOPHAGEAL ECHOCARDIOGRAM (TEE) (N/A) Subjective: Patient says he is feeling good and wants to go home Friday.  Objective: Vital signs in last 24 hours: Temp:  [97.9 F (36.6 C)-98.8 F (37.1 C)] 98 F (36.7 C) (08/03 0319) Pulse Rate:  [80-105] 80 (08/03 0319) Cardiac Rhythm: Normal sinus rhythm (08/02 2015) Resp:  [12-20] 18 (08/03 0319) BP: (97-129)/(46-94) 97/64 (08/03 0319) SpO2:  [87 %-99 %] 96 % (08/03 0319) Weight:  [86.1 kg-87 kg] 86.1 kg (08/03 0537)  Hemodynamic parameters for last 24 hours:    Intake/Output from previous day: 08/02 0701 - 08/03 0700 In: 1800 [P.O.:1800] Out: 425 [Urine:425] Intake/Output this shift: No intake/output data recorded.  General appearance: alert, cooperative, and no distress Neurologic: intact Heart: regular rate and rhythm, S1, S2 normal, no murmur, click, rub or gallop Lungs: clear to auscultation bilaterally Abdomen: soft, non-tender; bowel sounds normal; no masses,  no organomegaly Extremities: extremities normal, atraumatic, no cyanosis or edema Wound: Clean and dry  Lab Results: Recent Labs    08/12/21 0433 08/13/21 0238  WBC 14.0* 12.2*  HGB 11.2* 10.6*  HCT 32.8* 30.7*  PLT 174 172   BMET:  Recent Labs    08/12/21 0433 08/13/21 0238  NA 135 134*  K 4.0 3.7  CL 98 100  CO2 30 29  GLUCOSE 113* 98  BUN 9 9  CREATININE 0.82 0.80  CALCIUM 8.5* 8.3*    PT/INR:  Recent Labs    08/10/21 1353  LABPROT 15.5*  INR 1.2   ABG    Component Value Date/Time   PHART 7.334 (L) 08/10/2021 1731   HCO3 21.5 08/10/2021 1731   TCO2 23 08/10/2021 1731   ACIDBASEDEF 4.0 (H) 08/10/2021 1731   O2SAT 98 08/10/2021 1731   CBG (last 3)  Recent Labs    08/12/21 0042 08/12/21 0633 08/12/21 1139  GLUCAP 127* 122* 126*     Assessment/Plan: S/P Procedure(s) (LRB): CORONARY ARTERY BYPASS GRAFTING (CABG) TIMES 5, USING LEFT INTERNAL MAMMARY ARTERY AND RIGHT GREATER SAPHENOUS VEIN HARVESTED ENDOSCOPICALLY (N/A) TRANSESOPHAGEAL ECHOCARDIOGRAM (TEE) (N/A)  1.CV: Patient in NSR, d/c EPW today. Will start Plavix after wires are removed. BP 97/64, on 12.5mg  Lopressor will continue to monitor 2.Pulm: O2 saturation 94% on RA. CXR shows decreased bibasilar atelectasis today. Patient walked twice yesterday. Continue IS and ambulation. 3.GI: Patient is passing gas and has a good appetite. No nausea. Awaiting BM. 4. Expected post op blood loss anemia: HGB/HCT 10.6/30.7 5. Extremities: minimal to no edema, lasix d/c after today's dose 5. Dispo: Remove EPW today, patient will likely go home this weekend    LOS: 6 days    Jenny Reichmann, PA-C  08/13/2021   Chart reviewed, patient examined, agree with above. He is doing well.  Only complaint is hiccups overnight. Will observe for now. Wt is back to baseline so will DC lasix. Pacing wires out today. Plan home tomorrow if bowels move and no arrhythmias.

## 2021-08-13 NOTE — Progress Notes (Signed)
Seen pt from 1212-1248, pt declined ambulation due to walking twice this morning independently. Pt was educated on sternal precautions, restrictions, IS use after d/c, heart healthy diet, ex guidelines, and CRPII. Pt is being referred to CRPII at [facility].   Faustino Congress 08/13/2021 12:52 PM

## 2021-08-14 MED ORDER — OXYCODONE HCL 5 MG PO TABS: 5.0000 mg | ORAL_TABLET | ORAL | 0 refills | Status: DC | PRN

## 2021-08-14 MED ORDER — CLOPIDOGREL BISULFATE 75 MG PO TABS: 75.0000 mg | ORAL_TABLET | Freq: Every day | ORAL | 3 refills | Status: DC

## 2021-08-14 MED ORDER — ACETAMINOPHEN 500 MG PO TABS
500.0000 mg | ORAL_TABLET | Freq: Four times a day (QID) | ORAL | 0 refills | Status: DC | PRN
Start: 2021-08-14 — End: 2022-03-30

## 2021-08-14 MED ORDER — METOPROLOL TARTRATE 25 MG PO TABS: 25.0000 mg | ORAL_TABLET | Freq: Two times a day (BID) | ORAL | 3 refills | Status: DC

## 2021-08-14 MED ORDER — NICOTINE 14 MG/24HR TD PT24
14.0000 mg | MEDICATED_PATCH | Freq: Every day | TRANSDERMAL | 0 refills | Status: DC
Start: 2021-08-14 — End: 2021-09-09

## 2021-08-14 MED ORDER — ROSUVASTATIN CALCIUM 40 MG PO TABS: 40.0000 mg | ORAL_TABLET | Freq: Every day | ORAL | 3 refills | Status: DC

## 2021-08-14 MED ORDER — METOPROLOL TARTRATE 25 MG PO TABS
25.0000 mg | ORAL_TABLET | Freq: Two times a day (BID) | ORAL | Status: DC
  Administered 2021-08-14: 25 mg via ORAL
  Filled 2021-08-14: qty 1

## 2021-08-14 MED FILL — Sodium Chloride IV Soln 0.9%: INTRAVENOUS | Qty: 2000 | Status: AC

## 2021-08-14 MED FILL — Lidocaine HCl Local Soln Prefilled Syringe 100 MG/5ML (2%): INTRAMUSCULAR | Qty: 5 | Status: AC

## 2021-08-14 MED FILL — Heparin Sodium (Porcine) Inj 1000 Unit/ML: INTRAMUSCULAR | Qty: 30 | Status: AC

## 2021-08-14 MED FILL — Sodium Bicarbonate IV Soln 8.4%: INTRAVENOUS | Qty: 50 | Status: AC

## 2021-08-14 MED FILL — Mannitol IV Soln 20%: INTRAVENOUS | Qty: 500 | Status: AC

## 2021-08-14 MED FILL — Electrolyte-R (PH 7.4) Solution: INTRAVENOUS | Qty: 5000 | Status: AC

## 2021-08-14 MED FILL — Magnesium Sulfate Inj 50%: INTRAMUSCULAR | Qty: 10 | Status: AC

## 2021-08-14 MED FILL — Potassium Chloride Inj 2 mEq/ML: INTRAVENOUS | Qty: 40 | Status: AC

## 2021-08-14 MED FILL — Heparin Sodium (Porcine) Inj 1000 Unit/ML: Qty: 1000 | Status: AC

## 2021-08-14 NOTE — Progress Notes (Signed)
CARDIAC REHAB PHASE I   Offered to walk with pt. Pt states independent ambulation without difficulty. Reinforced d/c education. Encouraged sternal precautions, IS use, and ambulation. Referred to CRP II [place].  [id number] Reynold Bowen, RN BSN 08/14/2021 1:40 PM

## 2021-08-14 NOTE — Progress Notes (Addendum)
4 Days Post-Op Procedure(s) (LRB): CORONARY ARTERY BYPASS GRAFTING (CABG) TIMES 5, USING LEFT INTERNAL MAMMARY ARTERY AND RIGHT GREATER SAPHENOUS VEIN HARVESTED ENDOSCOPICALLY (N/A) TRANSESOPHAGEAL ECHOCARDIOGRAM (TEE) (N/A) Subjective: Patient states he had difficulty having a bowel movement last night but is otherwise anxious to go home.  Objective: Vital signs in last 24 hours: Temp:  [98 F (36.7 C)-98.9 F (37.2 C)] 98 F (36.7 C) (08/04 0351) Pulse Rate:  [92-100] 98 (08/04 0351) Cardiac Rhythm: Sinus tachycardia (08/03 1900) Resp:  [14-25] 16 (08/04 0351) BP: (113-139)/(67-85) 115/67 (08/04 0351) SpO2:  [98 %-100 %] 100 % (08/04 0351) Weight:  [79.7 kg] 79.7 kg (08/04 0400)  Hemodynamic parameters for last 24 hours:    Intake/Output from previous day: No intake/output data recorded. Intake/Output this shift: No intake/output data recorded.  General appearance: alert, cooperative, and no distress Heart: Normal sinus rhythm to sinus tachycardia, no murmur Lungs: clear to auscultation bilaterally Abdomen: soft, non-tender; bowel sounds normal; no masses,  no organomegaly Extremities: extremities normal, atraumatic, no cyanosis or edema Wound: Minimal clear drainage from RLE wounds, no erythema or purulent fluid   Lab Results: Recent Labs    08/12/21 0433 08/13/21 0238  WBC 14.0* 12.2*  HGB 11.2* 10.6*  HCT 32.8* 30.7*  PLT 174 172   BMET:  Recent Labs    08/12/21 0433 08/13/21 0238  NA 135 134*  K 4.0 3.7  CL 98 100  CO2 30 29  GLUCOSE 113* 98  BUN 9 9  CREATININE 0.82 0.80  CALCIUM 8.5* 8.3*    PT/INR: No results for input(s): "LABPROT", "INR" in the last 72 hours. ABG    Component Value Date/Time   PHART 7.334 (L) 08/10/2021 1731   HCO3 21.5 08/10/2021 1731   TCO2 23 08/10/2021 1731   ACIDBASEDEF 4.0 (H) 08/10/2021 1731   O2SAT 98 08/10/2021 1731   CBG (last 3)  Recent Labs    08/12/21 0042 08/12/21 0633 08/12/21 1139  GLUCAP 127* 122*  126*    Assessment/Plan: S/P Procedure(s) (LRB): CORONARY ARTERY BYPASS GRAFTING (CABG) TIMES 5, USING LEFT INTERNAL MAMMARY ARTERY AND RIGHT GREATER SAPHENOUS VEIN HARVESTED ENDOSCOPICALLY (N/A) TRANSESOPHAGEAL ECHOCARDIOGRAM (TEE) (N/A)  CV: BP improved after d/c lasix 115/67-139/82. Sinus tachycardia rate 100-113 will titrate lopressor to 25mg  BID. Will order EKG. Pulm: O2 saturation 100% on RA. Continue IS. GI: Constipation last night but had a BM with the use of stool softeners and suppository, feels better now. Will continue stool softeners. Continue ambulation. Wounds: RLE glue pulled off with minimal clear drainage, no signs of infection. Will steri strip area. Dispo: As discussed with Dr. with elevated HR patient needs to remain hospitalized for further observation.    LOS: 7 days    [name] 08/14/2021   Chart reviewed, patient examined, agree with above. ECG this am shows sinus rhythm and his HR is now down into 90's after increasing Lopressor dose. He feels well otherwise so I think he can go home this afternoon with his wife.

## 2021-08-14 NOTE — Progress Notes (Signed)
EPW removed per order without difficulty.  Sites C/D/I Tolerated well.  Bed rest educated and initiated. Vital signs per protocol.

## 2021-08-17 NOTE — Telephone Encounter (Signed)
**Note De-Identified Russell Miller Obfuscation** Patient contacted regarding discharge from [facility] on 08/14/2021  Patient understands to follow up with provider Robin Searing, NP on 08/31/2021 at 9:30 at [address] in [place], [place] [address].  Patient understands discharge instructions? Yes Patient understands medications and regiment? Yes Patient understands to bring all medications to this visit? Yes  Are you enrolled in My Chart:  Not yet but the pt states that he plans to enroll.   Postop Surgical Patients:                What is your wound status? Per the pt his wound looks good.                      Any signs/ symptoms of infection (Temp, redness/ red streaks, swelling, purulent          drainage, foul odor or smell)? Per the pt no              Please do not place any creams/ lotions/ or antibiotic ointment on any surgical incisions/ wounds without physician approval. The pt verbalized understanding               Do you have any questions about your medications?  No, he did not.                                                                                                                                                    All medications (except pain medications) are to be filled by your Cardiologist AFTER your first post op appointment with them.  The pt verbalized understanding.                                                Are you taking your pain medication? Per the pt he has only taken 1 Oxycodone tablet since returning home from the hospital on 8/4 and Tylenol 500 mg daily to prevent pain.              How is your pain controlled? Per the pt, well.                                                                  Pain level? On a scale of 0 to 10 with 0 being the least amount of pain the pt rates his pain level at a 1.              If you **Note De-Identified Khalil Belote Obfuscation** require a refill on pain medications, know that the same medication/ amount may not be prescribed or a refill may not be given.  Please contact your pharmacy for refill  requests. The pt verbalized understanding.              Do you have help at home with ADL's? Yes, his wife.               Please refer to your Pre/post surgery booklet, there is a lot of useful information in it that may answer any questions you may have. The pt verbalized understanding.              Please note that it is ok to remove your surgical dressing, shower (soap/ water), and pat the incision dry. The pt verbalized understanding.  For surgery related questions staff will route a phone note to CV DIV TCTS TOC pool: The pt had no surgical questions during this call.  [facility] [address] [place], [place] [address]

## 2021-08-20 ENCOUNTER — Encounter (INDEPENDENT_AMBULATORY_CARE_PROVIDER_SITE_OTHER): Payer: Self-pay

## 2021-08-20 ENCOUNTER — Telehealth (HOSPITAL_COMMUNITY): Payer: Self-pay

## 2021-08-20 DIAGNOSIS — Z4802 Encounter for removal of sutures: Secondary | ICD-10-CM

## 2021-08-20 NOTE — Telephone Encounter (Signed)
Pt is not interested in the cardiac rehab program. Closed referral 

## 2021-08-26 NOTE — Progress Notes (Unsigned)
Office Visit    Patient Name: Russell Miller Date of Encounter: 08/26/2021  Primary Care Provider:  Patient, No Pcp Per Primary Cardiologist:  None Primary Electrophysiologist: None  Chief Complaint    Russell Miller is a 51 y.o. male with PMH of CAD acute MI 10/2009 with PCI and DES to LAD and OM1 s/p CABG x5, HLD, tobacco abuse, ischemic cardiomyopathy who presents today for [facility] visit following bypass surgery.  Past Medical History    Past Medical History:  Diagnosis Date   CAD (coronary artery disease) 2011   LCx infarction treated with DES and staged PCI of severe prox LAD stenosis treated with  DES.   Hyperglycemia    Hyperlipidemia    Ischemic cardiomyopathy    mild, with LVEF greater than 45%   Tobacco abuse    Past Surgical History:  Procedure Laterality Date   ANGIOPLASTY / STENTING FEMORAL     CAROTID STENT     of the mid left anterior descending with a single drug-eluting stent   CORONARY ARTERY BYPASS GRAFT N/A 08/10/2021   Procedure: CORONARY ARTERY BYPASS GRAFTING (CABG) TIMES 5, USING LEFT INTERNAL MAMMARY ARTERY AND RIGHT GREATER SAPHENOUS VEIN HARVESTED ENDOSCOPICALLY;  Surgeon: [name], MD;  Location: [facility] OR;  Service: Open Heart Surgery;  Laterality: N/A;   LEFT HEART CATH AND CORONARY ANGIOGRAPHY N/A 08/07/2021   Procedure: LEFT HEART CATH AND CORONARY ANGIOGRAPHY;  Surgeon: Kathleene Hazel, MD;  Location: [facility] INVASIVE CV LAB;  Service: Cardiovascular;  Laterality: N/A;   TEE WITHOUT CARDIOVERSION N/A 08/10/2021   Procedure: TRANSESOPHAGEAL ECHOCARDIOGRAM (TEE);  Surgeon: [name], MD;  Location: [facility];  Service: Open Heart Surgery;  Laterality: N/A;    Allergies  No Known Allergies  History of Present Illness    Russell Miller is a 51 year old male with above-mentioned past medical history who presents today for transition of care visit following CABG x5.  Russell Miller was originally seen by [name] in 2011 following acute  STEMI with PCI and DES x1 to first OM  and staged PCI to LAD.  He underwent nuclear stress test in 2013 that revealed low risk and demonstrated inferior lateral scar without ischemia.  He was lost to follow-up and not seen again until 07/2021 when he presented to the ED with complaints of intermittent chest pain.  He was noncompliant and stopped all of his medications.  His initial troponins in the ED were 1800 EKG showed normal sinus with peaked T waves anteriorly.  He was started on IV heparin and patient underwent LHC for NSTEMI that revealed severe triple-vessel CAD and patient was referred to CVTS for CABG.  He underwent successful bypass x5 that no complications postprocedure.  Patient was discharged home on 8/4 in satisfactory condition.   Since last being seen in the office patient reports***.  Patient denies chest pain, palpitations, dyspnea, PND, orthopnea, nausea, vomiting, dizziness, syncope, edema, weight gain, or early satiety.     ***Notes: -Cardiac rehab -GDMT ASA 81 mg, Plavix 75 mg, metoprolol tartrate 25 mg, rosuvastatin 40 mg Smoking cessation Medication compliance  Home Medications    Current Outpatient Medications  Medication Sig Dispense Refill   acetaminophen (TYLENOL) 500 MG tablet Take 1-2 tablets (500-1,000 mg total) by mouth every 6 (six) hours as needed for mild pain or fever. 30 tablet 0   aspirin 81 MG tablet Take 81 mg by mouth daily.     clopidogrel (PLAVIX) 75 MG tablet Take  1 tablet (75 mg total) by mouth daily. 30 tablet 3   metoprolol tartrate (LOPRESSOR) 25 MG tablet Take 1 tablet (25 mg total) by mouth 2 (two) times daily. 60 tablet 3   nicotine (NICODERM CQ - DOSED IN MG/24 HOURS) 14 mg/24hr patch Place 1 patch (14 mg total) onto the skin daily. 28 patch 0   nitroGLYCERIN (NITROSTAT) 0.4 MG SL tablet Place 1 tablet (0.4 mg total) under the tongue every 5 (five) minutes as needed. 25 tablet 5   oxyCODONE (OXY IR/ROXICODONE) 5 MG immediate release tablet  Take 1 tablet (5 mg total) by mouth every 4 (four) hours as needed for severe pain. 30 tablet 0   rosuvastatin (CRESTOR) 40 MG tablet Take 1 tablet (40 mg total) by mouth daily. 30 tablet 3   No current facility-administered medications for this visit.     Review of Systems  Please see the history of present illness.    (+)*** (+)***  All other systems reviewed and are otherwise negative except as noted above.  Physical Exam    Wt Readings from Last 3 Encounters:  08/14/21 175 lb 12.8 oz (79.7 kg)  07/13/19 190 lb (86.2 kg)  10/16/16 188 lb (85.3 kg)   XT:KWIOX were no vitals filed for this visit.,There is no height or weight on file to calculate BMI.  Constitutional:      Appearance: Healthy appearance. Not in distress.  Neck:     Vascular: JVD normal.  Pulmonary:     Effort: Pulmonary effort is normal.     Breath sounds: No wheezing. No rales. Diminished in the bases Cardiovascular:     Normal rate. Regular rhythm. Normal S1. Normal S2.      Murmurs: There is no murmur.  Edema:    Peripheral edema absent.  Abdominal:     Palpations: Abdomen is soft non tender. There is no hepatomegaly.  Skin:    General: Skin is warm and dry.  Neurological:     General: No focal deficit present.     Mental Status: Alert and oriented to person, place and time.     Cranial Nerves: Cranial nerves are intact.  EKG/LABS/Other Studies Reviewed    ECG personally reviewed by me today - ***  Risk Assessment/Calculations:   {Does this patient have ATRIAL FIBRILLATION?[phone]}        Lab Results  Component Value Date   WBC 12.2 (H) 08/13/2021   HGB 10.6 (L) 08/13/2021   HCT 30.7 (L) 08/13/2021   MCV 93.3 08/13/2021   PLT 172 08/13/2021   Lab Results  Component Value Date   CREATININE 0.80 08/13/2021   BUN 9 08/13/2021   NA 134 (L) 08/13/2021   K 3.7 08/13/2021   CL 100 08/13/2021   CO2 29 08/13/2021   Lab Results  Component Value Date   ALT 33 08/07/2021   AST 19  08/07/2021   ALKPHOS 66 08/07/2021   BILITOT 1.3 (H) 08/07/2021   Lab Results  Component Value Date   CHOL 247 (H) 08/08/2021   HDL 39 (L) 08/08/2021   LDLCALC 163 (H) 08/08/2021   LDLDIRECT 196.5 03/24/2010   TRIG 227 (H) 08/08/2021   CHOLHDL 6.3 08/08/2021    Lab Results  Component Value Date   HGBA1C 5.8 (H) 08/08/2021    Assessment & Plan    1.  Atherosclerotic coronary artery disease:  2.  History of CABG  3.  Pure hypercholesteremia  4.  Tobacco abuse      Disposition:  Follow-up with None or APP in *** months {Are you ordering a CV Procedure (e.g. stress test, cath, DCCV, TEE, etc)?   Press F2        :[id number]}   Medication Adjustments/Labs and Tests Ordered: Current medicines are reviewed at length with the patient today.  Concerns regarding medicines are outlined above.   Signed, Napoleon Form, Leodis Rains, NP 08/26/2021, 9:47 AM [facility]

## 2021-08-28 LAB — ECHO INTRAOPERATIVE TEE
AR max vel: 2.33 cm2
AV Area VTI: 2.32 cm2
AV Area mean vel: 2.34 cm2
AV Mean grad: 4 mmHg
AV Peak grad: 7.7 mmHg
Ao pk vel: 1.39 m/s
Height: 71 in
MV VTI: 3.3 cm2
Weight: 3200 oz

## 2021-08-31 ENCOUNTER — Ambulatory Visit (INDEPENDENT_AMBULATORY_CARE_PROVIDER_SITE_OTHER): Payer: Managed Care, Other (non HMO) | Admitting: Nurse Practitioner

## 2021-08-31 ENCOUNTER — Encounter: Payer: Self-pay | Admitting: Nurse Practitioner

## 2021-08-31 DIAGNOSIS — I319 Disease of pericardium, unspecified: Secondary | ICD-10-CM | POA: Diagnosis not present

## 2021-08-31 DIAGNOSIS — E7800 Pure hypercholesterolemia, unspecified: Secondary | ICD-10-CM

## 2021-08-31 DIAGNOSIS — Z951 Presence of aortocoronary bypass graft: Secondary | ICD-10-CM

## 2021-08-31 DIAGNOSIS — I300 Acute nonspecific idiopathic pericarditis: Secondary | ICD-10-CM

## 2021-08-31 DIAGNOSIS — F17200 Nicotine dependence, unspecified, uncomplicated: Secondary | ICD-10-CM | POA: Diagnosis not present

## 2021-08-31 MED ORDER — NITROGLYCERIN 0.4 MG SL SUBL
0.4000 mg | SUBLINGUAL_TABLET | SUBLINGUAL | 5 refills | Status: DC | PRN
Start: 2021-08-31 — End: 2023-11-02

## 2021-08-31 MED ORDER — COLCHICINE 0.6 MG PO TABS: 0.6000 mg | ORAL_TABLET | Freq: Two times a day (BID) | ORAL | 2 refills | Status: DC

## 2021-08-31 NOTE — Patient Instructions (Addendum)
Medication Instructions:  Colchicine 0.6mg  Take 1 tablet twice a day  *If you need a refill on your cardiac medications before your next appointment, please call your pharmacy*   Lab Work: None Ordered   Testing/Procedures: None Ordered   Follow-Up: At [facility], you and your health needs are our priority.  As part of our continuing mission to provide you with exceptional heart care, we have created designated Provider Care Teams.  These Care Teams include your primary Cardiologist (physician) and Advanced Practice Providers (APPs -  Physician Assistants and Nurse Practitioners) who all work together to provide you with the care you need, when you need it.  We recommend signing up for the patient portal called "MyChart".  Sign up information is provided on this After Visit Summary.  MyChart is used to connect with patients for Virtual Visits (Telemedicine).  Patients are able to view lab/test results, encounter notes, upcoming appointments, etc.  Non-urgent messages can be sent to your provider as well.   To learn more about what you can do with MyChart, go to [email or web address].    Your next appointment:   1 month(s)  The format for your next appointment:   In Person  Provider:   Robin Searing, NP       Other Instructions   Important Information About Sugar

## 2021-09-08 NOTE — Patient Instructions (Signed)

## 2021-09-08 NOTE — Progress Notes (Unsigned)
      [address]       [place] [address]             [phone]       HPI: Patient returns for routine postoperative follow-up having undergone CABG x 5 on 08/10/2021. The patient's early postoperative recovery while in the hospital progressed without complication.  He was discharged home on 08/14/2021.  Patient is here for follow-up, he requested earlier visit as he wishes to resume driving Since hospital discharge the patient reports ***.   Current Outpatient Medications  Medication Sig Dispense Refill   acetaminophen (TYLENOL) 500 MG tablet Take 1-2 tablets (500-1,000 mg total) by mouth every 6 (six) hours as needed for mild pain or fever. 30 tablet 0   aspirin 81 MG tablet Take 81 mg by mouth daily.     clopidogrel (PLAVIX) 75 MG tablet Take 1 tablet (75 mg total) by mouth daily. 30 tablet 3   colchicine 0.6 MG tablet Take 1 tablet (0.6 mg total) by mouth 2 (two) times daily. 60 tablet 2   metoprolol tartrate (LOPRESSOR) 25 MG tablet Take 1 tablet (25 mg total) by mouth 2 (two) times daily. 60 tablet 3   nicotine (NICODERM CQ - DOSED IN MG/24 HOURS) 14 mg/24hr patch Place 1 patch (14 mg total) onto the skin daily. 28 patch 0   nitroGLYCERIN (NITROSTAT) 0.4 MG SL tablet Place 1 tablet (0.4 mg total) under the tongue every 5 (five) minutes as needed. 25 tablet 5   oxyCODONE (OXY IR/ROXICODONE) 5 MG immediate release tablet Take 1 tablet (5 mg total) by mouth every 4 (four) hours as needed for severe pain. 30 tablet 0   rosuvastatin (CRESTOR) 40 MG tablet Take 1 tablet (40 mg total) by mouth daily. 30 tablet 3   No current facility-administered medications for this visit.    Physical Exam: ***  Diagnostic Tests: ***   A/P:  S/P CABG x 5  7/31 by Dr. [name] Pre-diabetes patient will need close follow up with primary care physician Cardiac Rehabilitation-patient is released to participate if they wish to do so Activity- continue to increase ambulation as  tolerated, sternal precautions as discussed, may resume driving if no longer using narcotic pain medications RTC   Lowella Dandy, PA-C [facility] [phone]

## 2021-09-09 ENCOUNTER — Ambulatory Visit (INDEPENDENT_AMBULATORY_CARE_PROVIDER_SITE_OTHER): Payer: Self-pay | Admitting: Physician Assistant

## 2021-09-09 ENCOUNTER — Other Ambulatory Visit: Payer: Self-pay | Admitting: Surgery

## 2021-09-09 ENCOUNTER — Ambulatory Visit
Admission: RE | Admit: 2021-09-09 | Discharge: 2021-09-09 | Disposition: A | Discharge status: Home / Self Care | Payer: Managed Care, Other (non HMO) | Source: Ambulatory Visit | Attending: Surgery | Admitting: Surgery

## 2021-09-09 VITALS — BP 111/78 | HR 73 | Resp 18 | Ht 71.0 in | Wt 190.0 lb

## 2021-09-09 DIAGNOSIS — Z951 Presence of aortocoronary bypass graft: Secondary | ICD-10-CM

## 2021-09-09 MED ORDER — NICOTINE 14 MG/24HR TD PT24: 14.0000 mg | MEDICATED_PATCH | Freq: Every day | TRANSDERMAL | 0 refills | Status: DC

## 2021-09-23 ENCOUNTER — Ambulatory Visit: Payer: Managed Care, Other (non HMO) | Admitting: Surgery

## 2021-09-28 NOTE — Progress Notes (Unsigned)
Office Visit    Patient Name: Russell Miller Date of Encounter: 09/30/2021  Primary Care Provider:  Patient, No Pcp Per Primary Cardiologist:  None Primary Electrophysiologist: None  Chief Complaint    Russell Miller is a 51 y.o. male with PMH of CAD acute MI 10/2009 with PCI and DES to LAD and OM1 s/p CABG x5, HLD, tobacco abuse, ischemic cardiomyopathy who presents today for 1 month follow-up of coronary artery disease.  Past Medical History    Past Medical History:  Diagnosis Date   CAD (coronary artery disease) 2011   LCx infarction treated with DES and staged PCI of severe prox LAD stenosis treated with  DES.   Hyperglycemia    Hyperlipidemia    Ischemic cardiomyopathy    mild, with LVEF greater than 45%   Tobacco abuse    Past Surgical History:  Procedure Laterality Date   ANGIOPLASTY / STENTING FEMORAL     CAROTID STENT     of the mid left anterior descending with a single drug-eluting stent   CORONARY ARTERY BYPASS GRAFT N/A 08/10/2021   Procedure: CORONARY ARTERY BYPASS GRAFTING (CABG) TIMES 5, USING LEFT INTERNAL MAMMARY ARTERY AND RIGHT GREATER SAPHENOUS VEIN HARVESTED ENDOSCOPICALLY;  Surgeon: [name], MD;  Location: [facility] OR;  Service: Open Heart Surgery;  Laterality: N/A;   LEFT HEART CATH AND CORONARY ANGIOGRAPHY N/A 08/07/2021   Procedure: LEFT HEART CATH AND CORONARY ANGIOGRAPHY;  Surgeon: Kathleene Hazel, MD;  Location: [facility] INVASIVE CV LAB;  Service: Cardiovascular;  Laterality: N/A;   TEE WITHOUT CARDIOVERSION N/A 08/10/2021   Procedure: TRANSESOPHAGEAL ECHOCARDIOGRAM (TEE);  Surgeon: [name], MD;  [facility];  Service: Open Heart Surgery;  Laterality: N/A;    Allergies  No Known Allergies  History of Present Illness    Russell Miller is a 51 year old male with above-mentioned past medical history who presents today for transition of care visit following CABG x5.  Russell Miller was originally seen by Russell Miller in 2011  following acute STEMI with PCI and DES x1 to first OM  and staged PCI to LAD.  He underwent nuclear stress test in 2013 that revealed low risk and demonstrated inferior lateral scar without ischemia.  He was lost to follow-up and not seen again until 07/2021 when he presented to the ED with complaints of intermittent chest pain.  He was noncompliant and stopped all of his medications.  His initial troponins in the ED were 1800 EKG showed normal sinus with peaked T waves anteriorly.  He was started on IV heparin and patient underwent LHC for NSTEMI that revealed severe triple-vessel CAD and patient was referred to CVTS for CABG.  He underwent successful bypass x5 that no complications postprocedure.  Patient was discharged home on 8/4 in satisfactory condition.  Neuro follow-up patient reported that he has started exercise program with walking twice daily and experiences no shortness of breath.  Patient's EKG did show nonspecific ST changes DOD Russell Miller recommended colchicine twice daily x3 months.   Russell Miller presents today alone for his 1 month follow-up.  Since last being seen in the office patient reports that he has been doing well with no cardiac complaints.  He is compliant with his medications denies any complications or adverse effects.  He is continue to abstain from cigarette smoking and was congratulated for this.  He is utilizing nicotine patches for this.  He is currently being cleared to increase regular activity by thoracic surgery  and has resumed cutting grass.  Patient denies chest pain, palpitations, dyspnea, PND, orthopnea, nausea, vomiting, dizziness, syncope, edema, weight gain, or early satiety.   Home Medications    Current Outpatient Medications  Medication Sig Dispense Refill   acetaminophen (TYLENOL) 500 MG tablet Take 1-2 tablets (500-1,000 mg total) by mouth every 6 (six) hours as needed for mild pain or fever. 30 tablet 0   aspirin 81 MG tablet Take 81 mg by mouth daily.      clopidogrel (PLAVIX) 75 MG tablet Take 1 tablet (75 mg total) by mouth daily. 30 tablet 3   colchicine 0.6 MG tablet Take 1 tablet (0.6 mg total) by mouth 2 (two) times daily. 60 tablet 2   metoprolol tartrate (LOPRESSOR) 25 MG tablet Take 1 tablet (25 mg total) by mouth 2 (two) times daily. 60 tablet 3   nicotine (NICODERM CQ - DOSED IN MG/24 HOURS) 14 mg/24hr patch Place 1 patch (14 mg total) onto the skin daily. 28 patch 0   nitroGLYCERIN (NITROSTAT) 0.4 MG SL tablet Place 1 tablet (0.4 mg total) under the tongue every 5 (five) minutes as needed. 25 tablet 5   rosuvastatin (CRESTOR) 40 MG tablet Take 1 tablet (40 mg total) by mouth daily. 30 tablet 3   No current facility-administered medications for this visit.     Review of Systems  Please see the history of present illness.     All other systems reviewed and are otherwise negative except as noted above.  Physical Exam    Wt Readings from Last 3 Encounters:  09/30/21 192 lb 3.2 oz (87.2 kg)  09/09/21 190 lb (86.2 kg)  08/31/21 185 lb (83.9 kg)   VS: Vitals:   09/30/21 1341  BP: 114/72  Pulse: 63  SpO2: 97%  ,Body mass index is 26.81 kg/m.  Constitutional:      Appearance: Healthy appearance. Not in distress.  Neck:     Vascular: JVD normal.  Pulmonary:     Effort: Pulmonary effort is normal.     Breath sounds: No wheezing. No rales. Diminished in the bases Cardiovascular:     Normal rate. Regular rhythm. Normal S1. Normal S2.      Murmurs: There is no murmur.  Midsternal incision is clean dry and intact Edema:    Peripheral edema absent.  Abdominal:     Palpations: Abdomen is soft non tender. There is no hepatomegaly.  Skin:    General: Skin is warm and dry.  Neurological:     General: No focal deficit present.     Mental Status: Alert and oriented to person, place and time.     Cranial Nerves: Cranial nerves are intact.  EKG/LABS/Other Studies Reviewed    ECG personally reviewed by me today -none  completed today  Lab Results  Component Value Date   WBC 12.2 (H) 08/13/2021   HGB 10.6 (L) 08/13/2021   HCT 30.7 (L) 08/13/2021   MCV 93.3 08/13/2021   PLT 172 08/13/2021   Lab Results  Component Value Date   CREATININE 0.80 08/13/2021   BUN 9 08/13/2021   NA 134 (L) 08/13/2021   K 3.7 08/13/2021   CL 100 08/13/2021   CO2 29 08/13/2021   Lab Results  Component Value Date   ALT 33 08/07/2021   AST 19 08/07/2021   ALKPHOS 66 08/07/2021   BILITOT 1.3 (H) 08/07/2021   Lab Results  Component Value Date   CHOL 247 (H) 08/08/2021   HDL 39 (L) 08/08/2021  LDLCALC 163 (H) 08/08/2021   LDLDIRECT 196.5 03/24/2010   TRIG 227 (H) 08/08/2021   CHOLHDL 6.3 08/08/2021    Lab Results  Component Value Date   HGBA1C 5.8 (H) 08/08/2021    Assessment & Plan    1.  Atherosclerotic coronary artery disease: -CAD acute MI 10/2009 with PCI and DES to LAD and OM1 s/p CABG x5 -Today patient reports no chest pain or anginal equivalent -Continue GDMT with Plavix 75 mg daily, ASA 81 mg daily, metoprolol 25 mg daily and Crestor 40 mg daily -He is resumed physical activity and is currently watching his diet and abstaining from excess saturated fat.  2.  Idiopathic pericarditis: -Patient has no complaints of fever or chest discomfort -Patient advised to stop colchicine  3.  Pure hypercholesteremia: -Last LDL was 163 on 07/2021 -Patient restarted Crestor 40 mg daily -Patient will complete LFTs and lipids and 6 weeks  4.  Tobacco abuse -Patient reported during previous visit using nicotine patches for smoking cessation -Today patient reports that he is not smoking currently  Disposition: Follow-up with None or APP in 6 months    Medication Adjustments/Labs and Tests Ordered: Current medicines are reviewed at length with the patient today.  Concerns regarding medicines are outlined above.   Signed, Mable Fill, Marissa Nestle, NP 09/30/2021, 2:13 PM [employer]

## 2021-09-30 ENCOUNTER — Encounter: Payer: Self-pay | Admitting: Nurse Practitioner

## 2021-09-30 ENCOUNTER — Ambulatory Visit: Payer: Managed Care, Other (non HMO) | Attending: Nurse Practitioner | Admitting: Nurse Practitioner

## 2021-09-30 VITALS — BP 114/72 | HR 63 | Ht 71.0 in | Wt 192.2 lb

## 2021-09-30 DIAGNOSIS — F17200 Nicotine dependence, unspecified, uncomplicated: Secondary | ICD-10-CM

## 2021-09-30 DIAGNOSIS — I2510 Atherosclerotic heart disease of native coronary artery without angina pectoris: Secondary | ICD-10-CM | POA: Diagnosis not present

## 2021-09-30 DIAGNOSIS — Z951 Presence of aortocoronary bypass graft: Secondary | ICD-10-CM

## 2021-09-30 DIAGNOSIS — I319 Disease of pericardium, unspecified: Secondary | ICD-10-CM

## 2021-09-30 DIAGNOSIS — E7800 Pure hypercholesterolemia, unspecified: Secondary | ICD-10-CM

## 2021-09-30 NOTE — Patient Instructions (Signed)
Medication Instructions:  Your physician recommends that you continue on your current medications as directed. Please refer to the Current Medication list given to you today.  *If you need a refill on your cardiac medications before your next appointment, please call your pharmacy*   Lab Work: Lipids, Hepatic function panel: fasting If you have labs (blood work) drawn today and your tests are completely normal, you will receive your results only by: [place] (if you have MyChart) OR A paper copy in the mail If you have any lab test that is abnormal or we need to change your treatment, we will call you to review the results.  Follow-Up: At [facility], you and your health needs are our priority.  As part of our continuing mission to provide you with exceptional heart care, we have created designated Provider Care Teams.  These Care Teams include your primary Cardiologist (physician) and Advanced Practice Providers (APPs -  Physician Assistants and Nurse Practitioners) who all work together to provide you with the care you need, when you need it.  We recommend signing up for the patient portal called "MyChart".  Sign up information is provided on this After Visit Summary.  MyChart is used to connect with patients for Virtual Visits (Telemedicine).  Patients are able to view lab/test results, encounter notes, upcoming appointments, etc.  Non-urgent messages can be sent to your provider as well.   To learn more about what you can do with MyChart, go to [email or web address].    Your next appointment:   6 month(s)  The format for your next appointment:   In Person  Provider:   Ambrose Pancoast, NP          Important Information About Sugar

## 2021-10-15 ENCOUNTER — Ambulatory Visit: Payer: Managed Care, Other (non HMO) | Attending: Nurse Practitioner

## 2021-10-15 DIAGNOSIS — I2510 Atherosclerotic heart disease of native coronary artery without angina pectoris: Secondary | ICD-10-CM

## 2021-10-15 DIAGNOSIS — Z951 Presence of aortocoronary bypass graft: Secondary | ICD-10-CM

## 2021-10-16 LAB — LIPID PANEL
Chol/HDL Ratio: 3 ratio (ref 0.0–5.0)
Cholesterol, Total: 122 mg/dL (ref 100–199)
HDL: 41 mg/dL (ref >39)
LDL Chol Calc (NIH): 60 mg/dL (ref 0–99)
Triglycerides: 115 mg/dL (ref 0–149)
VLDL Cholesterol Cal: 21 mg/dL (ref 5–40)

## 2021-10-16 LAB — HEPATIC FUNCTION PANEL
ALT: 28 IU/L (ref 0–44)
AST: 6 IU/L (ref 0–40)
Albumin: 4.3 g/dL (ref 4.1–5.1)
Alkaline Phosphatase: 79 IU/L (ref 44–121)
Bilirubin Total: 0.4 mg/dL (ref 0.0–1.2)
Bilirubin, Direct: 0.13 mg/dL (ref 0.00–0.40)
Total Protein: 6.8 g/dL (ref 6.0–8.5)

## 2021-12-08 ENCOUNTER — Other Ambulatory Visit: Payer: Self-pay | Admitting: Physician Assistant

## 2021-12-15 ENCOUNTER — Other Ambulatory Visit: Payer: Self-pay

## 2021-12-15 MED ORDER — METOPROLOL TARTRATE 25 MG PO TABS: 25.0000 mg | ORAL_TABLET | Freq: Two times a day (BID) | ORAL | 3 refills | Status: DC

## 2021-12-15 MED ORDER — CLOPIDOGREL BISULFATE 75 MG PO TABS: 75.0000 mg | ORAL_TABLET | Freq: Every day | ORAL | 3 refills | Status: DC

## 2021-12-15 MED ORDER — ROSUVASTATIN CALCIUM 40 MG PO TABS: 40.0000 mg | ORAL_TABLET | Freq: Every day | ORAL | 3 refills | Status: DC

## 2022-03-13 ENCOUNTER — Other Ambulatory Visit: Payer: Self-pay | Admitting: Nurse Practitioner

## 2022-03-29 NOTE — Progress Notes (Unsigned)
Office Visit    Patient Name: Russell Miller Date of Encounter: 03/29/2022  Primary Care Provider:  Patient, No Pcp Per Primary Cardiologist:  None Primary Electrophysiologist: None  Chief Complaint   Russell Miller is a 52 y.o. male with PMH of CAD acute MI 10/2009 with PCI and DES to LAD and OM1 s/p CABG x5, HLD, tobacco abuse, ischemic cardiomyopathy who presents today for 56-month follow-up of coronary artery disease.  Past Medical History    Past Medical History:  Diagnosis Date   CAD (coronary artery disease) 2011   LCx infarction treated with DES and staged PCI of severe prox LAD stenosis treated with  DES.   Hyperglycemia    Hyperlipidemia    Ischemic cardiomyopathy    mild, with LVEF greater than 45%   Tobacco abuse    Past Surgical History:  Procedure Laterality Date   ANGIOPLASTY / STENTING FEMORAL     CAROTID STENT     of the mid left anterior descending with a single drug-eluting stent   CORONARY ARTERY BYPASS GRAFT N/A 08/10/2021   Procedure: CORONARY ARTERY BYPASS GRAFTING (CABG) TIMES 5, USING LEFT INTERNAL MAMMARY ARTERY AND RIGHT GREATER SAPHENOUS VEIN HARVESTED ENDOSCOPICALLY;  Surgeon: Gaye Pollack, MD;  Location: [place];  Service: Open Heart Surgery;  Laterality: N/A;   LEFT HEART CATH AND CORONARY ANGIOGRAPHY N/A 08/07/2021   Procedure: LEFT HEART CATH AND CORONARY ANGIOGRAPHY;  Surgeon: Burnell Blanks, MD;  Location: [place] [facility];  Service: Cardiovascular;  Laterality: N/A;   TEE WITHOUT CARDIOVERSION N/A 08/10/2021   Procedure: TRANSESOPHAGEAL ECHOCARDIOGRAM (TEE);  Surgeon: Gaye Pollack, MD;  Location: [place];  Service: Open Heart Surgery;  Laterality: N/A;    Allergies  No Known Allergies  History of Present Illness    Russell Miller is a 52 year old male with above-mentioned past medical history who presents today for 46-month follow-up of coronary artery disease.  Russell Miller was originally seen by Dr. Burt Knack in 2011  following acute STEMI with PCI and DES x1 to first OM  and staged PCI to LAD.  He underwent nuclear stress test in 2013 that revealed low risk and demonstrated inferior lateral scar without ischemia.  He was lost to follow-up and not seen again until 07/2021 when he presented to the ED with complaints of intermittent chest pain.  He was noncompliant and stopped all of his medications.  His initial troponins in the ED were 1800 EKG showed normal sinus with peaked T waves anteriorly.  He was started on IV heparin and patient underwent LHC for NSTEMI that revealed severe triple-vessel CAD and patient was referred to CVTS for CABG.  He underwent successful bypass x5 that no complications postprocedure.  Patient was discharged home on 8/4 in satisfactory condition.  Neuro follow-up patient reported that he has started exercise program with walking twice daily and experiences no shortness of breath.  Patient's EKG did show nonspecific ST changes DOD Dr. Acie Fredrickson recommended colchicine twice daily x3 months.  He was seen 09/30/2021 and was doing well and had stopped smoking with the help of nicotine patches.    Since last being seen in the office patient reports that he has been doing well with no new cardiac complaints.  He has gained a significant amount of weight due to inactivity during the wintertime.  He works as a Development worker, international aid and is now currently doing more physical activity.  He is averaging 5 miles per day.  His blood pressures  are well-controlled at 118/80 and heart rate was 79.  He is currently suffering from what sounds like plantar fasciitis in both feet.  He is currently without a PCP and will be referred to a foot doctor today.  During our visit he discussed the desire to discontinue some of his medications.  I explained to him that due to his cardiac health some of the medications will be taken for life.  He expressed a full understanding and had no further questions.  Patient denies chest pain, palpitations,  dyspnea, PND, orthopnea, nausea, vomiting, dizziness, syncope, edema, weight gain, or early satiety.   Home Medications    Current Outpatient Medications  Medication Sig Dispense Refill   acetaminophen (TYLENOL) 500 MG tablet Take 1-2 tablets (500-1,000 mg total) by mouth every 6 (six) hours as needed for mild pain or fever. 30 tablet 0   aspirin 81 MG tablet Take 81 mg by mouth daily.     clopidogrel (PLAVIX) 75 MG tablet TAKE 1 TABLET BY MOUTH EVERY DAY 90 tablet 1   colchicine 0.6 MG tablet Take 1 tablet (0.6 mg total) by mouth 2 (two) times daily. 60 tablet 2   metoprolol tartrate (LOPRESSOR) 25 MG tablet TAKE 1 TABLET BY MOUTH TWICE A DAY 180 tablet 1   nicotine (NICODERM CQ - DOSED IN MG/24 HOURS) 14 mg/24hr patch Place 1 patch (14 mg total) onto the skin daily. 28 patch 0   nitroGLYCERIN (NITROSTAT) 0.4 MG SL tablet Place 1 tablet (0.4 mg total) under the tongue every 5 (five) minutes as needed. 25 tablet 5   rosuvastatin (CRESTOR) 40 MG tablet TAKE 1 TABLET BY MOUTH EVERY DAY 90 tablet 1   No current facility-administered medications for this visit.     Review of Systems  Please see the history of present illness.    (+) Bilateral foot pain (+) Deconditioning  All other systems reviewed and are otherwise negative except as noted above.  Physical Exam    Wt Readings from Last 3 Encounters:  09/30/21 192 lb 3.2 oz (87.2 kg)  09/09/21 190 lb (86.2 kg)  08/31/21 185 lb (83.9 kg)   [id number] were no vitals filed for this visit.,There is no height or weight on file to calculate BMI.  Constitutional:      Appearance: Healthy appearance. Not in distress.  Neck:     Vascular: JVD normal.  Pulmonary:     Effort: Pulmonary effort is normal.     Breath sounds: No wheezing. No rales. Diminished in the bases Cardiovascular:     Normal rate. Regular rhythm. Normal S1. Normal S2.      Murmurs: There is no murmur.  Edema:    Peripheral edema absent.  Abdominal:     Palpations:  Abdomen is soft non tender. There is no hepatomegaly.  Skin:    General: Skin is warm and dry.  Neurological:     General: No focal deficit present.     Mental Status: Alert and oriented to person, place and time.     Cranial Nerves: Cranial nerves are intact.  EKG/LABS/ Recent Cardiac Studies    ECG personally reviewed by me today -none completed today   Lab Results  Component Value Date   CREATININE 0.80 08/13/2021   BUN 9 08/13/2021   NA 134 (L) 08/13/2021   K 3.7 08/13/2021   CL 100 08/13/2021   CO2 29 08/13/2021   Lab Results  Component Value Date   ALT 28 10/15/2021   AST 6  10/15/2021   ALKPHOS 79 10/15/2021   BILITOT 0.4 10/15/2021   Lab Results  Component Value Date   CHOL 122 10/15/2021   HDL 41 10/15/2021   LDLCALC 60 10/15/2021   LDLDIRECT 196.5 03/24/2010   TRIG 115 10/15/2021   CHOLHDL 3.0 10/15/2021    Lab Results  Component Value Date   HGBA1C 5.8 (H) 08/08/2021    Cardiac Studies & Procedures   CARDIAC CATHETERIZATION  CARDIAC CATHETERIZATION 08/07/2021  Narrative   Prox RCA to Mid RCA lesion is 100% stenosed.   Mid Cx to Dist Cx lesion is 99% stenosed.   2nd Mrg lesion is 99% stenosed.   Ost Cx to Prox Cx lesion is 70% stenosed.   1st Diag lesion is 90% stenosed.   Mid LAD-1 lesion is 90% stenosed.   Mid LAD-2 lesion is 50% stenosed.   There is mild left ventricular systolic dysfunction.   LV end diastolic pressure is normal.   The left ventricular ejection fraction is 45-50% by visual estimate.  Severe triple vessel CAD The LAD is a large caliber vessel that courses to the apex. The mid LAD stented segment has severe restenosis. There is moderate stenosis just beyond the stented segment. The large caliber diagonal branch has severe ostial stenosis. The Circumflex is a large caliber vessel with moderately severe ostial stenosis. The mid stented segment involving the obtuse marginal branch has severe stent restenosis with TIMI-2 flow beyond  the stent. The large, dominant RCA is completely occluded in the mid segment. The distal vessel and branches fill from left to right collaterals. Mild LV systolic dysfunction with inferior wall hypokinesis. Normal LVEDP  Recommendations: Given severe triple vessel CAD including severe stent restenosis, medical non-compliance and ongoing tobacco abuse, I think bypass surgery is his best option for complete revascularization and long term patency. Will continue ASA, statin, Resume IV heparin drip 2 hours post sheath pull. Echo pending. Will consult CT surgery to discuss CABG.  Findings Coronary Findings Diagnostic  Dominance: Right  Left Anterior Descending Vessel is large. Mid LAD-1 lesion is 90% stenosed. The lesion was previously treated using a drug eluting stent over 2 years ago. Mid LAD-2 lesion is 50% stenosed.  First Diagonal Branch Vessel is moderate in size. 1st Diag lesion is 90% stenosed.  Left Circumflex Vessel is large. Ost Cx to Prox Cx lesion is 70% stenosed. Mid Cx to Dist Cx lesion is 99% stenosed.  Second Obtuse Marginal Branch 2nd Mrg lesion is 99% stenosed. The lesion was previously treated using a drug eluting stent over 2 years ago.  Right Coronary Artery Vessel is large. Prox RCA to Mid RCA lesion is 100% stenosed. The lesion is chronically occluded.  Third Right Posterolateral Branch Collaterals 3rd RPL filled by collaterals from 2nd Sept.  Intervention  No interventions have been documented.     ECHOCARDIOGRAM  ECHOCARDIOGRAM COMPLETE 08/07/2021  Narrative ECHOCARDIOGRAM REPORT    Patient Name:   AHREN ROGERS Eynon Date of Exam: 08/07/2021 Medical Rec #:  [id number]       Height:       71.0 in Accession #:    [id number]      Weight:       200.0 lb Date of Birth:  [birth year]      BSA:          2.109 m Patient Age:    50 years        BP:           120/79 mmHg  Patient Gender: M               HR:           87 bpm. Exam Location:   Inpatient  Procedure: 2D Echo, Cardiac Doppler and Color Doppler  Indications:    NSTEMI I21.4  History:        Patient has no prior history of Echocardiogram examinations. Cardiomyopathy, Previous Myocardial Infarction and CAD; Risk Factors:Dyslipidemia and Current Smoker.  Sonographer:    Ronny Flurry Referring Phys: [id number] [place]   1. Windows are not well visualized. hypokinesis of the inferolateral/inferior walls. Left ventricular ejection fraction, by estimation, is 50 to 55%. The left ventricle has low normal function. There is mild left ventricular hypertrophy. Left ventricular diastolic parameters are consistent with Grade II diastolic dysfunction (pseudonormalization). 2. Right ventricular systolic function is normal. The right ventricular size is normal. Tricuspid regurgitation signal is inadequate for assessing PA pressure. 3. 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 Mild mitral valve regurgitation. 4. The aortic valve is tricuspid. Aortic valve regurgitation is not visualized. 5. The inferior vena cava is normal in size with greater than 50% respiratory variability, suggesting right atrial pressure of 3 mmHg.  Comparison(s): No prior Echocardiogram.  FINDINGS Left Ventricle: Windows are not well visualized. hypokinesis of the inferolateral/inferior walls. Left ventricular ejection fraction, by estimation, is 50 to 55%. The left ventricle has low normal function. The left ventricular internal cavity size was normal in size. There is mild left ventricular hypertrophy. Left ventricular diastolic parameters are consistent with Grade II diastolic dysfunction (pseudonormalization).  Right Ventricle: The right ventricular size is normal. Right ventricular systolic function is normal. Tricuspid regurgitation signal is inadequate for assessing PA pressure.  Left Atrium: Left atrial size was normal in size.  Right Atrium: Right atrial size was normal in  size.  Pericardium: There is no evidence of pericardial effusion.  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 There is mild thickening of the mitral valve leaflet(s). Mild mitral valve regurgitation.  Tricuspid Valve: Tricuspid valve regurgitation is not demonstrated.  Aortic Valve: The aortic valve is tricuspid. Aortic valve regurgitation is not visualized.  Pulmonic Valve: Pulmonic valve regurgitation is not visualized.  Aorta: The aortic root and ascending aorta are structurally normal, with no evidence of dilitation.  Venous: The inferior vena cava is normal in size with greater than 50% respiratory variability, suggesting right atrial pressure of 3 mmHg.  IAS/Shunts: No atrial level shunt detected by color flow Doppler.   LEFT VENTRICLE PLAX 2D LVIDd:         1.95 cm   Diastology LVIDs:         4.25 cm   LV e' medial:    6.74 cm/s LV PW:         4.18 cm   LV E/e' medial:  13.8 LV IVS:        0.90 cm   LV e' lateral:   8.16 cm/s LVOT diam:     2.40 cm   LV E/e' lateral: 11.4 LV SV:         78 LV SV Index:   37 LVOT Area:     4.52 cm   RIGHT VENTRICLE TAPSE (M-mode): 2.1 cm  LEFT ATRIUM             Index        RIGHT ATRIUM          Index LA diam:        3.60 cm  1.71 cm/m   RA Area:     9.95 cm LA Vol (A2C):   29.3 ml 13.90 ml/m  RA Volume:   20.60 ml 9.77 ml/m LA Vol (A4C):   45.9 ml 21.77 ml/m LA Biplane Vol: 38.2 ml 18.12 ml/m AORTIC VALVE LVOT Vmax:   116.00 cm/s LVOT Vmean:  69.700 cm/s LVOT VTI:    0.172 m  AORTA Ao Root diam: 2.90 cm Ao Asc diam:  2.90 cm  MITRAL VALVE MV Area (PHT): 6.48 cm    SHUNTS MV Decel Time: 117 msec    Systemic VTI:  0.17 m MV E velocity: 93.00 cm/s  Systemic Diam: 2.40 cm MV A velocity: 88.30 cm/s MV E/A ratio:  1.05  Landscape architect signed by Phineas Inches Signature Date/Time: 08/07/2021/4:18:31 PM    Final   TEE  ECHO INTRAOPERATIVE TEE 08/28/2021  Narrative *INTRAOPERATIVE TRANSESOPHAGEAL  REPORT *    Patient Name:   ARIHAN VANDECAR Bocanegra Date of Exam: 08/10/2021 Medical Rec #:  [id number]       Height:       71.0 in Accession #:    [id number]      Weight:       200.0 lb Date of Birth:  13-Aug-1970      BSA:          2.11 m Patient Age:    65 years        BP:           145/101 mmHg Patient Gender: M               HR:           87 bpm. Exam Location:  Anesthesiology  Transesophogeal exam was perform intraoperatively during surgical procedure. Patient was closely monitored under general anesthesia during the entirety of examination.  Indications:     Coronary Artery Disease Sonographer:     Bernadene Person RDCS Performing Phys: [place] Diagnosing Phys: Oleta Mouse MD  Complications: No known complications during this procedure. POST-OP IMPRESSIONS _ Left Ventricle: has mildly reduced systolic function, with an ejection fraction of 50%. The cavity size was mildly dilated. The wall motion is same as preop. _ Right Ventricle: The right ventricle appears unchanged from pre-bypass. _ Aortic Valve: The aortic valve appears unchanged from pre-bypass. _ Mitral Valve: There is moderate to severe regurgitation. _ Tricuspid Valve: The tricuspid valve appears unchanged from pre-bypass.  PRE-OP FINDINGS Left Ventricle: The left ventricle has mildly reduced systolic function, with an ejection fraction of 45-50%. The cavity size was mildly dilated.   LV Wall Scoring: The inferior wall, posterior wall, mid inferoseptal segment, and basal inferoseptal segment are hypokinetic. The entire anterior wall, antero-lateral wall, entire anterior septum, and entire apex are normal.   Right Ventricle: The right ventricle has normal systolic function. The cavity was normal. There is no increase in right ventricular wall thickness.  Left Atrium: Left atrial size was normal in size. No left atrial/left atrial appendage thrombus was detected. The left atrial appendage is well  visualized and there is no evidence of thrombus present. Left atrial appendage velocity is normal at greater than 40 cm/s.  Right Atrium: Right atrial size was normal in size.  Interatrial Septum: Evidence of atrial level shunting detected by color flow Doppler. A small patent foramen ovale is detected with predominantly left to right shunting across the atrial septum.  Pericardium: There is no evidence of pericardial effusion.  Mitral Valve: The mitral valve is  dilated. There is moderate mitral annular calcification present. Mitral valve regurgitation is moderate by color flow Doppler. The MR jet is posteriorly-directed. Mild mitral annular dilatation. There is No evidence of mitral stenosis. There is moderate thickening and moderate calcification present on the mitral valve anterior cusp with normal mobility and there is moderate thickening and moderate calcification present on the mitral valve posterior cusp with posterior leaflet tethered during diastole resulting in relative prolapse of the anterior leaflet due to LV dysfunction in setting of CAD mobility.  Tricuspid Valve: The tricuspid valve was normal in structure. Tricuspid valve regurgitation is trivial by color flow Doppler. No evidence of tricuspid stenosis is present.  Aortic Valve: The aortic valve is tricuspid Aortic valve regurgitation was not visualized by color flow Doppler. There is no stenosis of the aortic valve, with a calculated valve area of 2.32 cm.   Pulmonic Valve: The pulmonic valve was normal in structure, with normal. No evidence of pumonic stenosis. Pulmonic valve regurgitation is trivial by color flow Doppler.   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  +--------------+--------++ LEFT VENTRICLE         +--------------+--------++ PLAX 2D                +--------------+--------++ LVOT diam:    2.00 cm  +--------------+--------++ LVOT Area:    3.14  cm +--------------+--------++                        +--------------+--------++  +------------------+-----------++ AORTIC VALVE                  +------------------+-----------++ AV Area (Vmax):   2.33 cm    +------------------+-----------++ AV Area (Vmean):  2.34 cm    +------------------+-----------++ AV Area (VTI):    2.32 cm    +------------------+-----------++ AV Vmax:          139.00 cm/s +------------------+-----------++ AV Vmean:         87.200 cm/s +------------------+-----------++ AV VTI:           0.275 m     +------------------+-----------++ AV Peak Grad:     7.7 mmHg    +------------------+-----------++ AV Mean Grad:     4.0 mmHg    +------------------+-----------++ LVOT Vmax:        103.00 cm/s +------------------+-----------++ LVOT Vmean:       64.950 cm/s +------------------+-----------++ LVOT VTI:         0.203 m     +------------------+-----------++ LVOT/AV VTI ratio:0.74        +------------------+-----------++  +--------------+-------++ AORTA                 +--------------+-------++ Ao Sinus diam:2.70 cm +--------------+-------++ Ao STJ diam:  2.3 cm  +--------------+-------++ Ao Asc diam:  2.97 cm +--------------+-------++  +-------------+---------++ MITRAL VALVE           +--------------+-------+ +-------------+---------++ SHUNTS                MV Peak grad:2.1 mmHg  +--------------+-------+ +-------------+---------++ Systemic VTI: 0.20 m  MV Mean grad:1.0 mmHg  +--------------+-------+ +-------------+---------++ Systemic Diam:2.00 cm MV Vmax:     0.73 m/s  +--------------+-------+ +-------------+---------++ MV Vmean:    36.2 cm/s +-------------+---------++ MV VTI:      0.19 m    +-------------+---------++   Oleta Mouse MD Electronically signed by Oleta Mouse MD Signature Date/Time: 08/28/2021/12:24:28  PM    Final            Assessment & Plan  1.  Atherosclerotic coronary artery disease: -CAD acute MI 10/2009 with PCI and DES to LAD and OM1 s/p CABG x5 -Today patient reports no chest pain or anginal equivalent -Continue GDMT with Plavix 75 mg daily, ASA 81 mg daily, metoprolol 25 mg daily and Crestor 40 mg daily -He was encouraged to increase physical activity and is currently watching his diet and abstaining from excess saturated fat.   2.  Idiopathic pericarditis: -Resolved with no further complaints of chest discomfort since previous visit.   3.  Pure hypercholesteremia: -Last LDL was 60 at goal on 10/2021 -Continue Crestor 40 mg daily -He was advised to increase physical activity to at least 150 minutes/week   4.  Tobacco abuse -Russell Miller is continuing to abstain from cigarette smoking however he is eating more to subsidize.  He was encouraged to increase his physical activity as tolerated. -He was congratulated for quitting and staying away from cigarettes.    Disposition: Follow-up with None or APP in 6 months    Medication Adjustments/Labs and Tests Ordered: Current medicines are reviewed at length with the patient today.  Concerns regarding medicines are outlined above.   Signed, Mable Fill, Marissa Nestle, NP 03/29/2022, 12:39 PM [facility]  Note:  This document was prepared using Dragon voice recognition software and may include unintentional dictation errors.

## 2022-03-30 ENCOUNTER — Ambulatory Visit: Payer: Managed Care, Other (non HMO) | Attending: Nurse Practitioner | Admitting: Nurse Practitioner

## 2022-03-30 ENCOUNTER — Encounter: Payer: Self-pay | Admitting: Nurse Practitioner

## 2022-03-30 VITALS — BP 118/80 | HR 79 | Ht 71.0 in | Wt 217.0 lb

## 2022-03-30 DIAGNOSIS — F17200 Nicotine dependence, unspecified, uncomplicated: Secondary | ICD-10-CM

## 2022-03-30 DIAGNOSIS — I2510 Atherosclerotic heart disease of native coronary artery without angina pectoris: Secondary | ICD-10-CM

## 2022-03-30 DIAGNOSIS — M722 Plantar fascial fibromatosis: Secondary | ICD-10-CM

## 2022-03-30 DIAGNOSIS — E7800 Pure hypercholesterolemia, unspecified: Secondary | ICD-10-CM

## 2022-03-30 DIAGNOSIS — I300 Acute nonspecific idiopathic pericarditis: Secondary | ICD-10-CM

## 2022-03-30 NOTE — Patient Instructions (Addendum)
Medication Instructions:  Your physician recommends that you continue on your current medications as directed. Please refer to the Current Medication list given to you today. *If you need a refill on your cardiac medications before your next appointment, please call your pharmacy*   Lab Work: None ordered If you have labs (blood work) drawn today and your tests are completely normal, you will receive your results only by: [place] (if you have MyChart) OR A paper copy in the mail If you have any lab test that is abnormal or we need to change your treatment, we will call you to review the results.   Testing/Procedures: None Ordered   Follow-Up: At [facility], you and your health needs are our priority.  As part of our continuing mission to provide you with exceptional heart care, we have created designated Provider Care Teams.  These Care Teams include your primary Cardiologist (physician) and Advanced Practice Providers (APPs -  Physician Assistants and Nurse Practitioners) who all work together to provide you with the care you need, when you need it.  We recommend signing up for the patient portal called "MyChart".  Sign up information is provided on this After Visit Summary.  MyChart is used to connect with patients for Virtual Visits (Telemedicine).  Patients are able to view lab/test results, encounter notes, upcoming appointments, etc.  Non-urgent messages can be sent to your provider as well.   To learn more about what you can do with MyChart, go to [email or web address].    Your next appointment:   6 month(s)  Provider:   Sherren Mocha, MD  Other Instructions  DR Donnie Coffin PCP AT [facility] You have been referred to [place]

## 2022-09-08 ENCOUNTER — Other Ambulatory Visit: Payer: Self-pay | Admitting: Nurse Practitioner

## 2022-10-26 NOTE — Progress Notes (Unsigned)
Cardiology Office Note    Patient Name: Russell Miller Date of Encounter: 10/26/2022  Primary Care Provider:  Patient, No Pcp Per Primary Cardiologist:  None Primary Electrophysiologist: None   Past Medical History    Past Medical History:  Diagnosis Date   CAD (coronary artery disease) 2011   LCx infarction treated with DES and staged PCI of severe prox LAD stenosis treated with  DES.   Hyperglycemia    Hyperlipidemia    Ischemic cardiomyopathy    mild, with LVEF greater than 45%   Tobacco abuse     History of Present Illness  Russell Miller is a 52 y.o. male with PMH of CAD acute MI 10/2009 with PCI and DES to LAD and OM1 s/p CABG x5, HLD, tobacco abuse, ischemic cardiomyopathy who presents today for 16-month follow-up of coronary artery disease.   Russell Miller was last seen on 03/30/2022 for follow-up visit.  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  His blood pressure was well-controlled.  During his visit he expressed the desire to discontinue his medications and we discussed the importance of continuing medications to prevent progression of coronary disease.  He was advised to continue exercising and to reach out to [place] if he had any further questions.  During today's visit the patient reports that he has been doing well with no new cardiac complaints since his previous follow-up.  He is still working as a Administrator and has continued to abstain from smoking over the past year.  He was congratulated for this effort.  His blood pressure today was well-controlled at 118/88 and heart rate was 63 bpm.  He has gained a total of 27 pounds since he stopped smoking.  He is also very sedentary outside of work and was encouraged to increase his physical activity.  He is compliant with his current medications and denies any adverse reactions.  During today's visit we discussed the importance of secondary prevention measures such  as med compliance and following a heart healthy diet.  He has followed a heart healthy diet since his open heart surgery denies any consumption of butter he uses primarily avocado oil based products.  Patient denies chest pain, palpitations, dyspnea, PND, orthopnea, nausea, vomiting, dizziness, syncope, edema, weight gain, or early satiety.   Review of Systems  Please see the history of present illness.    All other systems reviewed and are otherwise negative except as noted above.  Physical Exam    Wt Readings from Last 3 Encounters:  03/30/22 217 lb (98.4 kg)  09/30/21 192 lb 3.2 oz (87.2 kg)  09/09/21 190 lb (86.2 kg)   WU:[id number] were no vitals filed for this visit.,There is no height or weight on file to calculate BMI. GEN: Well nourished, well developed in no acute distress Neck: No JVD; No carotid bruits Pulmonary: Clear to auscultation without rales, wheezing or rhonchi  Cardiovascular: Normal rate. Regular rhythm. Normal S1. Normal S2.   Murmurs: There is no murmur.  ABDOMEN: Soft, non-tender, non-distended EXTREMITIES:  No edema; No deformity   EKG/LABS/ Recent Cardiac Studies   ECG personally reviewed by me today -sinus rhythm with rate of 63 bpm and no acute changes since previous EKG.  Risk Assessment/Calculations:          Lab Results  Component Value Date   WBC 12.2 (H) 08/13/2021   HGB 10.6 (L) 08/13/2021   HCT 30.7 (L) 08/13/2021  MCV 93.3 08/13/2021   PLT 172 08/13/2021   Lab Results  Component Value Date   CREATININE 0.80 08/13/2021   BUN 9 08/13/2021   NA 134 (L) 08/13/2021   K 3.7 08/13/2021   CL 100 08/13/2021   CO2 29 08/13/2021   Lab Results  Component Value Date   CHOL 122 10/15/2021   HDL 41 10/15/2021   LDLCALC 60 10/15/2021   LDLDIRECT 196.5 03/24/2010   TRIG 115 10/15/2021   CHOLHDL 3.0 10/15/2021    Lab Results  Component Value Date   HGBA1C 5.8 (H) 08/08/2021   Assessment & Plan    1.  Coronary artery disease: -CAD acute MI  10/2009 with PCI and DES to LAD and OM1 s/p CABG x5 -Today patient reports no episodes of chest pain with physical activity. -He has gained a total of 27 pounds since his open heart procedure which is primarily due to giving up tobacco abuse. -Discontinue metoprolol due to fatigue patient will continue other GDMT with Plavix 75 mg daily, Crestor 40 mg daily. -Patient encouraged to increase physical activity to at least 150 minutes/week -He was also advised to continue a low-sodium heart healthy diet. -We will check TSH and BMET today due to weight gain to rule out possible hypothyroidism.  2.  Pure hypercholesteremia: -Patient's last LDL cholesterol was controlled at 60 -We will repeat LFTs and lipids today -Continue Crestor 40 mg daily   3.  Tobacco abuse: -Patient reports continued cessation and was congratulated for his efforts. -Patient has gained excess weight since stopping and was encouraged to increase physical activity  4.  Obesity: -Patient's BMI is 31.46 kg/m. -Patient advised to increase physical activity to at least 150 minutes/week. -Ambulatory referral to establish with PCP.  Disposition: Follow-up with None or APP in 12 months    Signed, Napoleon Form, Leodis Rains, NP 10/26/2022, 1:15 PM [facility]

## 2022-10-27 ENCOUNTER — Ambulatory Visit: Payer: Managed Care, Other (non HMO) | Attending: Nurse Practitioner | Admitting: Nurse Practitioner

## 2022-10-27 ENCOUNTER — Encounter: Payer: Self-pay | Admitting: Nurse Practitioner

## 2022-10-27 VITALS — BP 118/88 | HR 63 | Ht 71.0 in | Wt 225.6 lb

## 2022-10-27 DIAGNOSIS — E6609 Other obesity due to excess calories: Secondary | ICD-10-CM

## 2022-10-27 DIAGNOSIS — Z6830 Body mass index (BMI) 30.0-30.9, adult: Secondary | ICD-10-CM

## 2022-10-27 DIAGNOSIS — I2510 Atherosclerotic heart disease of native coronary artery without angina pectoris: Secondary | ICD-10-CM | POA: Diagnosis not present

## 2022-10-27 DIAGNOSIS — Z951 Presence of aortocoronary bypass graft: Secondary | ICD-10-CM

## 2022-10-27 DIAGNOSIS — E7800 Pure hypercholesterolemia, unspecified: Secondary | ICD-10-CM | POA: Diagnosis not present

## 2022-10-27 DIAGNOSIS — F17200 Nicotine dependence, unspecified, uncomplicated: Secondary | ICD-10-CM | POA: Diagnosis not present

## 2022-10-27 DIAGNOSIS — E66811 Obesity, class 1: Secondary | ICD-10-CM

## 2022-10-27 NOTE — Patient Instructions (Addendum)
Medication Instructions:  DECREASE Metoprolol to every other day for 1 week then STOP *If you need a refill on your cardiac medications before your next appointment, please call your pharmacy*   Lab Work: TODAY-CMET, CBC, LIPIDS, & TSH If you have labs (blood work) drawn today and your tests are completely normal, you will receive your results only by: MyChart Message (if you have MyChart) OR A paper copy in the mail If you have any lab test that is abnormal or we need to change your treatment, we will call you to review the results.   Testing/Procedures: NONE ORDERED   Follow-Up: At [facility], you and your health needs are our priority.  As part of our continuing mission to provide you with exceptional heart care, we have created designated Provider Care Teams.  These Care Teams include your primary Cardiologist (physician) and Advanced Practice Providers (APPs -  Physician Assistants and Nurse Practitioners) who all work together to provide you with the care you need, when you need it.  We recommend signing up for the patient portal called "MyChart".  Sign up information is provided on this After Visit Summary.  MyChart is used to connect with patients for Virtual Visits (Telemedicine).  Patients are able to view lab/test results, encounter notes, upcoming appointments, etc.  Non-urgent messages can be sent to your provider as well.   To learn more about what you can do with MyChart, go to [email or web address].    Your next appointment:   12 month(s)  Provider:   Tonny Bollman, MD   Other Instructions: CONTACT EAGLE TANNEBAUM AT [phone] TO SET UP AN APPOINTMENT  GET A BLOOD PRESSURE CUFF  Check your blood pressure daily for 2 weeks, then contact the office with your readings.  Make sure to check 2 hours after your medications.   AVOID these things for 30 minutes before checking your blood pressure: No Drinking caffeine. No Drinking alcohol. No Eating. No  Smoking. No Exercising.  Five minutes before checking your blood pressure: Pee. Sit in a dining chair. Avoid sitting in a soft couch or armchair. Be quiet. Do not talk.

## 2022-10-28 LAB — COMPREHENSIVE METABOLIC PANEL
ALT: 42 [IU]/L (ref 0–44)
AST: 12 [IU]/L (ref 0–40)
Albumin: 4.4 g/dL (ref 3.8–4.9)
Alkaline Phosphatase: 109 [IU]/L (ref 44–121)
BUN/Creatinine Ratio: 8 — ABNORMAL LOW (ref 9–20)
BUN: 7 mg/dL (ref 6–24)
Bilirubin Total: <0.2 mg/dL (ref 0.0–1.2)
CO2: 22 mmol/L (ref 20–29)
Calcium: 9.4 mg/dL (ref 8.7–10.2)
Chloride: 104 mmol/L (ref 96–106)
Creatinine, Ser: 0.9 mg/dL (ref 0.76–1.27)
Globulin, Total: 2.7 g/dL (ref 1.5–4.5)
Glucose: 112 mg/dL — ABNORMAL HIGH (ref 70–99)
Potassium: 4.6 mmol/L (ref 3.5–5.2)
Sodium: 140 mmol/L (ref 134–144)
Total Protein: 7.1 g/dL (ref 6.0–8.5)
eGFR: 103 mL/min/{1.73_m2} (ref >59)

## 2022-10-28 LAB — CBC
Hematocrit: 48.2 % (ref 37.5–51.0)
Hemoglobin: 15.8 g/dL (ref 13.0–17.7)
MCH: 30.7 pg (ref 26.6–33.0)
MCHC: 32.8 g/dL (ref 31.5–35.7)
MCV: 94 fL (ref 79–97)
Platelets: 271 10*3/uL (ref 150–450)
RBC: 5.14 x10E6/uL (ref 4.14–5.80)
RDW: 12 % (ref 11.6–15.4)
WBC: 6.4 10*3/uL (ref 3.4–10.8)

## 2022-10-28 LAB — TSH: TSH: 1.9 u[IU]/mL (ref 0.450–4.500)

## 2022-10-28 LAB — LIPID PANEL
Chol/HDL Ratio: 3.6 {ratio} (ref 0.0–5.0)
Cholesterol, Total: 136 mg/dL (ref 100–199)
HDL: 38 mg/dL — ABNORMAL LOW (ref >39)
LDL Chol Calc (NIH): 73 mg/dL (ref 0–99)
Triglycerides: 144 mg/dL (ref 0–149)
VLDL Cholesterol Cal: 25 mg/dL (ref 5–40)

## 2023-09-13 ENCOUNTER — Telehealth: Payer: Self-pay | Admitting: Cardiovascular Disease

## 2023-09-13 MED ORDER — ROSUVASTATIN CALCIUM 40 MG PO TABS: 40.0000 mg | ORAL_TABLET | Freq: Every day | ORAL | 0 refills | Status: DC

## 2023-09-13 MED ORDER — CLOPIDOGREL BISULFATE 75 MG PO TABS: 75.0000 mg | ORAL_TABLET | Freq: Every day | ORAL | 0 refills | Status: DC

## 2023-09-13 NOTE — Telephone Encounter (Signed)
 *  STAT* If patient is at the pharmacy, call can be transferred to refill team.   1. Which medications need to be refilled? (please list name of each medication and dose if known)   clopidogrel  (PLAVIX ) 75 MG tablet    rosuvastatin  (CRESTOR ) 40 MG tablet     2. Would you like to learn more about the convenience, safety, & potential cost savings by using the [facility]? No      3. Are you open to using the [facility] (Type [facility]. ).no    4. Which pharmacy/location (including street and city if local pharmacy) is medication to be sent to?  [id number] [place],  - [address]     5. Do they need a 30 day or 90 day supply? 90 days   Patient is out of medication for a week

## 2023-09-13 NOTE — Telephone Encounter (Signed)
 RX sent in

## 2023-10-28 ENCOUNTER — Ambulatory Visit: Admitting: Physician Assistant

## 2023-11-01 NOTE — Progress Notes (Unsigned)
 Cardiology Office Note   Date:  11/02/2023  ID:  Granger, Chui 1971/01/10, MRN [id number] PCP: Patient, No Pcp Per  [facility] Providers Cardiologist:  Ozell Fell, MD   History of Present Illness Russell Miller is a 53 y.o. male with a past medical history of CAD, acute MI 10/2009 with PCI and DES to LAD and OM1 status post CABG x 5, HLD, tobacco abuse, ischemic cardiomyopathy who presents for routine follow-up appointment.  He was seen in March 2024 for follow-up visit.  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  Blood pressure is well-controlled.  During his visit he expressed that desire to discontinue his medications and we discussed the importance of continuing medications to prevent progression of coronary artery disease.  He was advised to continue exercising and reach out if he had any further questions.  He was seen in the office 10/27/2022 for follow-up.  He was doing well without any cardiac complaints.  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  His blood pressure then was well-controlled 118/88 with a heart rate of 63 bpm.  He had gained a total of 27 pounds since he stopped smoking.  Also very sedentary outside of work and was encouraged to increase his physical activity.  Compliant with current medications.  Been following a heart healthy diet since his open heart surgery.  Today, he presents with a history of coronary artery disease and CABG for follow-up regarding his cardiovascular health.  He denies current chest pain, dyspnea, or peripheral edema. He is no longer taking metoprolol  but continues on Plavix  and aspirin . He smokes two to three cigarettes daily and is interested in using nicotine  patches to quit. His last cardiac catheterization in 2023 showed multiple blockages, some addressed by CABG. Recent lipid panel and A1c are within acceptable ranges, with an  LDL of 52 and an A1c of 5.8. He feels better with increased water intake.He is encouraged to lessen the sugar in his diet and recheck an A1c with his PCP.   Reports no shortness of breath nor dyspnea on exertion. Reports no chest pain, pressure, or tightness. No edema, orthopnea, PND. Reports no palpitations.   Discussed the use of AI scribe software for clinical note transcription with the patient, who gave verbal consent to proceed.  ROS: Pertinent ROS in HPI  Studies Reviewed     Cardiac Cath  Left Anterior Descending  Vessel is large.  Mid LAD-1 lesion is 90% stenosed. The lesion was previously treated using a drug eluting stent over 2 years ago.  Mid LAD-2 lesion is 50% stenosed.    First Diagonal Branch  Vessel is moderate in size.  1st Diag lesion is 90% stenosed.    Left Circumflex  Vessel is large.  Ost Cx to Prox Cx lesion is 70% stenosed.  Mid Cx to Dist Cx lesion is 99% stenosed.    Second Obtuse Marginal Branch  2nd Mrg lesion is 99% stenosed. The lesion was previously treated using a drug eluting stent over 2 years ago.    Right Coronary Artery  Vessel is large.  Prox RCA to Mid RCA lesion is 100% stenosed. The lesion is chronically occluded.    Third Right Posterolateral Branch  Collaterals  3rd RPL filled by collaterals from 2nd Sept.      Intervention   No interventions have been documented.   Wall  Motion              Left Heart  Left Ventricle The left ventricular size is normal. There is mild left ventricular systolic dysfunction. LV end diastolic pressure is normal. The left ventricular ejection fraction is 45-50% by visual estimate. There are LV function abnormalities due to segmental dysfunction.   Coronary Diagrams  Diagnostic Dominance: Right  Intervention      Physical Exam VS:  BP 117/76 (BP Location: Left Arm, Patient Position: Sitting, Cuff Size: Normal)   Pulse 78   Ht 5' 10 (1.778 m)   Wt 212 lb 3.2 oz (96.3 kg)   SpO2  97%   BMI 30.45 kg/m        Wt Readings from Last 3 Encounters:  11/02/23 212 lb 3.2 oz (96.3 kg)  10/27/22 225 lb 9.6 oz (102.3 kg)  03/30/22 217 lb (98.4 kg)    GEN: Well nourished, well developed in no acute distress NECK: No JVD; No carotid bruits CARDIAC: RRR, no murmurs, rubs, gallops RESPIRATORY:  Clear to auscultation without rales, wheezing or rhonchi  ABDOMEN: Soft, non-tender, non-distended EXTREMITIES:  No edema; No deformity   ASSESSMENT AND PLAN  Atherosclerotic heart disease status post coronary artery bypass grafting Status post CABG x5 in 2023 with possible residual blockages. No current symptoms. Heart rate and blood pressure controlled. Occasional asymptomatic PACs/PVCs. Collateral circulation from LAD to RCA. Continues on Plavix  and aspirin  for secondary prevention. - Continue Plavix  and aspirin  lifelong. - Educated on seeking immediate care if chest pain recurs.  Nicotine  dependence Currently smokes 2-3 cigarettes per day. Interested in nicotine  replacement therapy. - Prescribe nicotine  patches starting at a lower dose. - Adjust nicotine  patch dose as needed based on smoking habits.  Pure hypercholesterolemia Lipid panel from 2023 showed LDL at 52, below target. Triglycerides normal. - Order lipid panel with annual labs through primary care provider.  Prediabetes A1c was 5.8 in 2023, indicating prediabetes. - Monitor A1c with annual labs through primary care provider. Coronary artery disease s/p CABG x 5     Dispo: He can follow-up in a yea with me or Dr. Wonda  Signed, Orren LOISE Fabry, PA-C

## 2023-11-02 ENCOUNTER — Ambulatory Visit: Attending: Physician Assistant | Admitting: Physician Assistant

## 2023-11-02 ENCOUNTER — Encounter: Payer: Self-pay | Admitting: Physician Assistant

## 2023-11-02 VITALS — BP 117/76 | HR 78 | Ht 70.0 in | Wt 212.2 lb

## 2023-11-02 DIAGNOSIS — I2510 Atherosclerotic heart disease of native coronary artery without angina pectoris: Secondary | ICD-10-CM

## 2023-11-02 DIAGNOSIS — F17200 Nicotine dependence, unspecified, uncomplicated: Secondary | ICD-10-CM | POA: Diagnosis not present

## 2023-11-02 DIAGNOSIS — Z951 Presence of aortocoronary bypass graft: Secondary | ICD-10-CM

## 2023-11-02 DIAGNOSIS — E7800 Pure hypercholesterolemia, unspecified: Secondary | ICD-10-CM | POA: Diagnosis not present

## 2023-11-02 MED ORDER — NICOTINE 7 MG/24HR TD PT24: 7.0000 mg | MEDICATED_PATCH | Freq: Every day | TRANSDERMAL | 3 refills | Status: AC

## 2023-11-02 MED ORDER — NITROGLYCERIN 0.4 MG SL SUBL: 0.4000 mg | SUBLINGUAL_TABLET | SUBLINGUAL | 5 refills | Status: AC | PRN

## 2023-11-02 MED ORDER — CLOPIDOGREL BISULFATE 75 MG PO TABS: 75.0000 mg | ORAL_TABLET | Freq: Every day | ORAL | 2 refills | Status: AC

## 2023-11-02 MED ORDER — ROSUVASTATIN CALCIUM 40 MG PO TABS: 40.0000 mg | ORAL_TABLET | Freq: Every day | ORAL | 2 refills | Status: AC

## 2023-11-02 NOTE — Patient Instructions (Signed)
 Medication Instructions:  Your physician recommends that you continue on your current medications as directed. Please refer to the Current Medication list given to you today. *If you need a refill on your cardiac medications before your next appointment, please call your pharmacy*  Lab Work: None ordered If you have labs (blood work) drawn today and your tests are completely normal, you will receive your results only by: MyChart Message (if you have MyChart) OR A paper copy in the mail If you have any lab test that is abnormal or we need to change your treatment, we will call you to review the results.  Testing/Procedures: None ordered  Follow-Up: At [facility], you and your health needs are our priority.  As part of our continuing mission to provide you with exceptional heart care, our providers are all part of one team.  This team includes your primary Cardiologist (physician) and Advanced Practice Providers or APPs (Physician Assistants and Nurse Practitioners) who all work together to provide you with the care you need, when you need it.  Your next appointment:   12 month(s)  Provider:   Ozell Fell, MD or APP   We recommend signing up for the patient portal called MyChart.  Sign up information is provided on this After Visit Summary.  MyChart is used to connect with patients for Virtual Visits (Telemedicine).  Patients are able to view lab/test results, encounter notes, upcoming appointments, etc.  Non-urgent messages can be sent to your provider as well.   To learn more about what you can do with MyChart, go to [email or web address].   Other Instructions

## 2024-01-16 ENCOUNTER — Telehealth: Payer: Self-pay | Admitting: Cardiovascular Disease

## 2024-01-16 NOTE — Telephone Encounter (Signed)
" ° °*  STAT* If patient is at the pharmacy, call can be transferred to refill team.   1. Which medications need to be refilled? (please list name of each medication and dose if known) clopidogrel  (PLAVIX ) 75 MG tablet   nitroGLYCERIN  (NITROSTAT ) 0.4 MG SL tablet    2. Would you like to learn more about the convenience, safety, & potential cost savings by using the [facility]? No      3. Are you open to using the [facility] (Type [facility]. ). No    4. Which pharmacy/location (including street and city if local pharmacy) is medication to be sent to? [id number] [place], [place] - [address]    5. Do they need a 30 day or 90 day supply? 30 days    Patient has been out of meds 2 days ago. Need refills today His nitroglycerin  he accidentally washed it. It was inside his pocket in his pants  "
